# Patient Record
Sex: Female | Born: 1955 | Race: White | Hispanic: No | Marital: Married | Smoking: Never smoker
Health system: Southern US, Community
[De-identification: ages and names within clinical notes are randomized; demographics above are authoritative.]

## PROBLEM LIST (undated history)

## (undated) DIAGNOSIS — K635 Polyp of colon: Secondary | ICD-10-CM

## (undated) DIAGNOSIS — F419 Anxiety disorder, unspecified: Secondary | ICD-10-CM

## (undated) DIAGNOSIS — F329 Major depressive disorder, single episode, unspecified: Secondary | ICD-10-CM

## (undated) DIAGNOSIS — L570 Actinic keratosis: Secondary | ICD-10-CM

## (undated) DIAGNOSIS — B019 Varicella without complication: Secondary | ICD-10-CM

## (undated) DIAGNOSIS — M1990 Unspecified osteoarthritis, unspecified site: Secondary | ICD-10-CM

## (undated) DIAGNOSIS — K589 Irritable bowel syndrome without diarrhea: Secondary | ICD-10-CM

## (undated) DIAGNOSIS — A6000 Herpesviral infection of urogenital system, unspecified: Secondary | ICD-10-CM

## (undated) DIAGNOSIS — C4491 Basal cell carcinoma of skin, unspecified: Secondary | ICD-10-CM

## (undated) DIAGNOSIS — F32A Depression, unspecified: Secondary | ICD-10-CM

## (undated) DIAGNOSIS — E559 Vitamin D deficiency, unspecified: Secondary | ICD-10-CM

## (undated) DIAGNOSIS — K5792 Diverticulitis of intestine, part unspecified, without perforation or abscess without bleeding: Secondary | ICD-10-CM

## (undated) DIAGNOSIS — W5501XA Bitten by cat, initial encounter: Secondary | ICD-10-CM

## (undated) HISTORY — DX: Diverticulitis of intestine, part unspecified, without perforation or abscess without bleeding: K57.92

## (undated) HISTORY — DX: Anxiety disorder, unspecified: F41.9

## (undated) HISTORY — PX: CATARACT EXTRACTION: SUR2

## (undated) HISTORY — DX: Major depressive disorder, single episode, unspecified: F32.9

## (undated) HISTORY — DX: Polyp of colon: K63.5

## (undated) HISTORY — DX: Irritable bowel syndrome without diarrhea: K58.9

## (undated) HISTORY — PX: CHOLECYSTECTOMY: SHX55

## (undated) HISTORY — DX: Depression, unspecified: F32.A

## (undated) HISTORY — PX: ABDOMINAL HYSTERECTOMY: SHX81

## (undated) HISTORY — DX: Herpesviral infection of urogenital system, unspecified: A60.00

## (undated) HISTORY — DX: Bitten by cat, initial encounter: W55.01XA

## (undated) HISTORY — DX: Actinic keratosis: L57.0

## (undated) HISTORY — DX: Varicella without complication: B01.9

## (undated) HISTORY — DX: Basal cell carcinoma of skin, unspecified: C44.91

## (undated) HISTORY — DX: Unspecified osteoarthritis, unspecified site: M19.90

## (undated) HISTORY — DX: Vitamin D deficiency, unspecified: E55.9

---

## 2007-02-19 ENCOUNTER — Ambulatory Visit: Payer: Self-pay

## 2007-03-19 ENCOUNTER — Ambulatory Visit: Payer: Self-pay | Admitting: Internal Medicine

## 2018-03-17 ENCOUNTER — Ambulatory Visit: Payer: Managed Care, Other (non HMO) | Admitting: Internal Medicine

## 2018-03-17 ENCOUNTER — Encounter: Payer: Self-pay | Admitting: Internal Medicine

## 2018-03-17 VITALS — BP 136/76 | HR 70 | Temp 98.5°F | Ht 62.0 in | Wt 116.4 lb

## 2018-03-17 DIAGNOSIS — Z23 Encounter for immunization: Secondary | ICD-10-CM

## 2018-03-17 DIAGNOSIS — Z1389 Encounter for screening for other disorder: Secondary | ICD-10-CM

## 2018-03-17 DIAGNOSIS — Z1231 Encounter for screening mammogram for malignant neoplasm of breast: Secondary | ICD-10-CM

## 2018-03-17 DIAGNOSIS — Z13220 Encounter for screening for lipoid disorders: Secondary | ICD-10-CM

## 2018-03-17 DIAGNOSIS — K581 Irritable bowel syndrome with constipation: Secondary | ICD-10-CM

## 2018-03-17 DIAGNOSIS — R1310 Dysphagia, unspecified: Secondary | ICD-10-CM

## 2018-03-17 DIAGNOSIS — E559 Vitamin D deficiency, unspecified: Secondary | ICD-10-CM

## 2018-03-17 DIAGNOSIS — Z13818 Encounter for screening for other digestive system disorders: Secondary | ICD-10-CM

## 2018-03-17 DIAGNOSIS — E8940 Asymptomatic postprocedural ovarian failure: Secondary | ICD-10-CM | POA: Insufficient documentation

## 2018-03-17 DIAGNOSIS — Z0000 Encounter for general adult medical examination without abnormal findings: Secondary | ICD-10-CM | POA: Diagnosis not present

## 2018-03-17 DIAGNOSIS — F419 Anxiety disorder, unspecified: Secondary | ICD-10-CM | POA: Diagnosis not present

## 2018-03-17 DIAGNOSIS — F329 Major depressive disorder, single episode, unspecified: Secondary | ICD-10-CM

## 2018-03-17 DIAGNOSIS — F32A Depression, unspecified: Secondary | ICD-10-CM | POA: Insufficient documentation

## 2018-03-17 DIAGNOSIS — Z1159 Encounter for screening for other viral diseases: Secondary | ICD-10-CM

## 2018-03-17 NOTE — Progress Notes (Signed)
Pre visit review using our clinic review tool, if applicable. No additional management support is needed unless otherwise documented below in the visit note. 

## 2018-03-17 NOTE — Progress Notes (Signed)
Chief Complaint  Patient presents with  . Establish Care   NP  1. Anxiety and depression controlled on prozac 40 and wellbutrin 300 mg qd 2.IBS C and abdominal pain chronic linzess 290 helps and gabapentin 400 qid helps per pt bentyl did not help in the past nor Levsin for abdominal pain  3. H/o vitamin D def pt wants to sch comprehensive labs  4. Intermittently co dysphagia and trouble swallowing food she reports dentist saw something in her throat and wanted PCP to check but normal exam today advised if sx's continue refer GI or ENT. Past EGD 2014  Review of Systems  Constitutional: Negative for weight loss.  HENT: Negative for hearing loss and sore throat.   Eyes: Negative for blurred vision.  Respiratory: Negative for shortness of breath.   Cardiovascular: Negative for chest pain.  Gastrointestinal: Negative for heartburn.       +dysphagia   Musculoskeletal: Negative for joint pain.  Neurological: Negative for headaches.  Psychiatric/Behavioral: Negative for depression. The patient is not nervous/anxious.    Past Medical History:  Diagnosis Date  . Anxiety   . Chicken pox   . Colon polyps    2014   . Depression   . Diverticulitis   . IBS (irritable bowel syndrome)    constipation  . Vitamin D deficiency    Past Surgical History:  Procedure Laterality Date  . ABDOMINAL HYSTERECTOMY     2007 fibroids   . CHOLECYSTECTOMY     2016   Family History  Problem Relation Age of Onset  . Arthritis Mother   . Depression Mother   . Hearing loss Mother   . Hypertension Mother   . Asthma Father   . Cancer Father        kidney cancer not a smoker   . Kidney disease Father   . Arthritis Sister   . Hypertension Sister   . Cancer Brother        nmsc skin ca   Social History   Socioeconomic History  . Marital status: Married    Spouse name: Not on file  . Number of children: Not on file  . Years of education: Not on file  . Highest education level: Not on file   Occupational History  . Not on file  Social Needs  . Financial resource strain: Not on file  . Food insecurity:    Worry: Not on file    Inability: Not on file  . Transportation needs:    Medical: Not on file    Non-medical: Not on file  Tobacco Use  . Smoking status: Never Smoker  . Smokeless tobacco: Never Used  . Tobacco comment: light smoker short duration   Substance and Sexual Activity  . Alcohol use: Yes    Comment: occassionally  . Drug use: Not Currently  . Sexual activity: Not Currently    Comment: men  Lifestyle  . Physical activity:    Days per week: Not on file    Minutes per session: Not on file  . Stress: Not on file  Relationships  . Social connections:    Talks on phone: Not on file    Gets together: Not on file    Attends religious service: Not on file    Active member of club or organization: Not on file    Attends meetings of clubs or organizations: Not on file    Relationship status: Not on file  . Intimate partner violence:    Fear  of current or ex partner: Not on file    Emotionally abused: Not on file    Physically abused: Not on file    Forced sexual activity: Not on file  Other Topics Concern  . Not on file  Social History Narrative   Divorced    1. 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 buPROPion (WELLBUTRIN XL) 300 MG 24 hr tablet Take 300 mg by mouth daily.  Marland Kitchen estradiol (ESTRACE) 0.5 MG tablet Take 0.5 mg by mouth daily.  Marland Kitchen FLUoxetine (PROZAC) 40 MG capsule Take 40 mg by mouth daily.  Marland Kitchen gabapentin (NEURONTIN) 400 MG capsule Take 400 mg by mouth 4 (four) times daily.   Marland Kitchen linaclotide (LINZESS) 290 MCG CAPS capsule Take 290 mcg by mouth daily before breakfast.  . MELATONIN PO Take 0.5 mg by mouth daily.   Allergies  Allergen Reactions  . Penicillins     Numbness in fingers child     No results found for this or any previous visit (from the  past 2160 hour(s)). Objective  Body mass index is 21.29 kg/m. Wt Readings from Last 3 Encounters:  03/17/18 116 lb 6.4 oz (52.8 kg)   Temp Readings from Last 3 Encounters:  03/17/18 98.5 F (36.9 C) (Oral)   BP Readings from Last 3 Encounters:  03/17/18 136/76   Pulse Readings from Last 3 Encounters:  03/17/18 70    Physical Exam  Constitutional: She is oriented to person, place, and time. Vital signs are normal. She appears well-developed and well-nourished. She is cooperative.  HENT:  Head: Normocephalic and atraumatic.  Mouth/Throat: Oropharynx is clear and moist and mucous membranes are normal.  Eyes: Pupils are equal, round, and reactive to light. Conjunctivae are normal.  Cardiovascular: Normal rate, regular rhythm and normal heart sounds.  Pulmonary/Chest: Effort normal and breath sounds normal.  Neurological: She is alert and oriented to person, place, and time. Gait normal.  Skin: Skin is warm, dry and intact.  Psychiatric: She has a normal mood and affect. Her speech is normal and behavior is normal. Judgment and thought content normal. Cognition and memory are normal.  Nursing note and vitals reviewed.   Assessment   1. Anxiety and depression  2. Postsurgical menopause  3. IBS C 4. Vit D def  5. Dysphagia intermittently  6. HM Plan   1. Cont meds  sch fasting labs  2. Continue estradiol 0.5 mg qd and gabapentin helping  3. Cont linzess per pt gabapentin helping IBS though unfamiliar with indication for chronic ab pain she states helps nerve pain  4. Labs  5. If not better refer GI vs ENT  6.  Flu shot given today  Disc shingrix vaccine today  Get records Tdap former PCP Get records mammogram former [facility] [place], EGD/colonoscopy (h/o polpys 2014 due repeat 5 years pt declines for now - former PCP Dr. Lourena Simmonds in [place]  -ordered mammo given info pt to schedule  S/p hysterectomy for fibroids 2007 no h/o abnormal pap  Referred  mammogram today given info to schedule  Former light smoker not long term  H/o Aks dermatology saw last years no need for now consider in future   Provider: Dr. Olivia Mackie McLean-Scocuzza-[facility]

## 2018-03-17 NOTE — Patient Instructions (Addendum)
Shingrix vaccine  Zeasorb AF Powder for rash under breasts     Intertrigo Intertrigo is skin irritation or inflammation (dermatitis) that occurs when folds of skin rub together. The irritation can cause a rash and make skin raw and itchy. This condition most commonly occurs in the skin folds of these areas:  Toes.  Armpits.  Groin.  Belly.  Breasts.  Buttocks.  Intertrigo is not passed from person to person (is not contagious). What are the causes? This condition is caused by heat, moisture, friction, and lack of air circulation. The condition can be made worse by:  Sweat.  Bacteria or a fungus, such as yeast.  What increases the risk? This condition is more likely to occur if you have moisture in your skin folds. It is also more likely to develop in people who:  Have diabetes.  Are overweight.  Are on bed rest.  Live in a warm and moist climate.  Wear splints, braces, or other medical devices.  Are not able to control their bowels or bladder (have incontinence).  What are the signs or symptoms? Symptoms of this condition include:  A pink or red skin rash.  Brown patches on the skin.  Raw or scaly skin.  Itchiness.  A burning feeling.  Bleeding.  Leaking fluid.  A bad smell.  How is this diagnosed? This condition is diagnosed with a medical history and physical exam. You may also have a skin swab to test for bacteria or a fungus, such as yeast. How is this treated? Treatment may include:  Cleaning and drying your skin.  An oral antibiotic medicine or antibiotic skin cream for a bacterial infection.  Antifungal cream or pills for an infection that was caused by a fungus, such as yeast.  Steroid ointment to relieve itchiness and irritation.  Follow these instructions at home:  Keep the affected area clean and dry.  Do not scratch your skin.  Stay in a cool environment as much as possible. Use an air conditioner or fan, if  available.  Apply over-the-counter and prescription medicines only as told by your health care provider.  If you were prescribed an antibiotic medicine, use it as told by your health care provider. Do not stop using the antibiotic even if your condition improves.  Keep all follow-up visits as told by your health care provider. This is important. How is this prevented?  Maintain a healthy weight.  Take care of your feet, especially if you have diabetes. Foot care includes: ? Wearing shoes that fit well. ? Keeping your feet dry. ? Wearing clean, breathable socks.  Protect the skin around your groin and buttocks, especially if you have incontinence. Skin protection includes: ? Following a regular cleaning routine. ? Using moisturizers and skin protectants. ? Changing protection pads frequently.  Do not wear tight clothes. Wear clothes that are loose and absorbent. Wear clothes that are made of cotton.  Wear a bra that gives good support, if needed.  Shower and dry yourself thoroughly after activity. Use a hair dryer on a cool setting to dry between skin folds, especially after you bathe.  If you have diabetes, keep your blood sugar under control. Contact a health care provider if:  Your symptoms do not improve with treatment.  Your symptoms get worse or they spread.  You notice increased redness and warmth.  You have a fever. This information is not intended to replace advice given to you by your health care provider. Make sure you discuss any  questions you have with your health care provider. Document Released: 06/17/2005 Document Revised: 11/23/2015 Document Reviewed: 12/19/2014 Elsevier Interactive Patient Education  2018 [place] Zoster (Shingles) Vaccine, RZV: What You Need to Know 1. Why get vaccinated? Shingles (also called herpes zoster, or just zoster) is a painful skin rash, often with blisters. Shingles is caused by the varicella zoster virus,  the same virus that causes chickenpox. After you have chickenpox, the virus stays in your body and can cause shingles later in life. You can't catch shingles from another person. However, a person who has never had chickenpox (or chickenpox vaccine) could get chickenpox from someone with shingles. A shingles rash usually appears on one side of the face or body and heals within 2 to 4 weeks. Its main symptom is pain, which can be severe. Other symptoms can include fever, headache, chills and upset stomach. Very rarely, a shingles infection can lead to pneumonia, hearing problems, blindness, brain inflammation (encephalitis), or death. For about 1 person in 5, severe pain can continue even long after the rash has cleared up. This long-lasting pain is called post-herpetic neuralgia (PHN). Shingles is far more common in people 49 years of age and older than in younger people, and the risk increases with age. It is also more common in people whose immune system is weakened because of a disease such as cancer, or by drugs such as steroids or chemotherapy. At least 1 million people a year in the [place] get shingles. 2. Shingles vaccine (recombinant) Recombinant shingles vaccine was approved by FDA in 2017 for the prevention of shingles. In clinical trials, it was more than 90% effective in preventing shingles. It can also reduce the likelihood of PHN. Two doses, 2 to 6 months apart, are recommended for adults 60 and older. This vaccine is also recommended for people who have already gotten the live shingles vaccine (Zostavax). There is no live virus in this vaccine. 3. Some people should not get this vaccine Tell your vaccine provider if you:  Have any severe, life-threatening allergies. A person who has ever had a life-threatening allergic reaction after a dose of recombinant shingles vaccine, or has a severe allergy to any component of this vaccine, may be advised not to be vaccinated. Ask your health  care provider if you want information about vaccine components.  Are pregnant or breastfeeding. There is not much information about use of recombinant shingles vaccine in pregnant or nursing women. Your healthcare provider might recommend delaying vaccination.  Are not feeling well. If you have a mild illness, such as a cold, you can probably get the vaccine today. If you are moderately or severely ill, you should probably wait until you recover. Your doctor can advise you.  4. Risks of a vaccine reaction With any medicine, including vaccines, there is a chance of reactions. After recombinant shingles vaccination, a person might experience:  Pain, redness, soreness, or swelling at the site of the injection  Headache, muscle aches, fever, shivering, fatigue  In clinical trials, most people got a sore arm with mild or moderate pain after vaccination, and some also had redness and swelling where they got the shot. Some people felt tired, had muscle pain, a headache, shivering, fever, stomach pain, or nausea. About 1 out of 6 people who got recombinant zoster vaccine experienced side effects that prevented them from doing regular activities. Symptoms went away on their own in about 2 to 3 days. Side effects were more common in  younger people. You should still get the second dose of recombinant zoster vaccine even if you had one of these reactions after the first dose. Other things that could happen after this vaccine:  People sometimes faint after medical procedures, including vaccination. Sitting or lying down for about 15 minutes can help prevent fainting and injuries caused by a fall. Tell your provider if you feel dizzy or have vision changes or ringing in the ears.  Some people get shoulder pain that can be more severe and longer-lasting than routine soreness that can follow injections. This happens very rarely.  Any medication can cause a severe allergic reaction. Such reactions to a vaccine  are estimated at about 1 in a million doses, and would happen within a few minutes to a few hours after the vaccination. As with any medicine, there is a very remote chance of a vaccine causing a serious injury or death. The safety of vaccines is always being monitored. For more information, visit: http://www.aguilar.org/ 5. What if there is a serious problem? What should I look for?  Look for anything that concerns you, such as signs of a severe allergic reaction, very high fever, or unusual behavior. Signs of a severe allergic reaction can include hives, swelling of the face and throat, difficulty breathing, a fast heartbeat, dizziness, and weakness. These would usually start a few minutes to a few hours after the vaccination. What should I do?  If you think it is a severe allergic reaction or other emergency that can't wait, call 9-1-1 and get to the nearest hospital. Otherwise, call your health care provider. Afterward, the reaction should be reported to the Vaccine Adverse Event Reporting System (VAERS). Your doctor should file this report, or you can do it yourself through the VAERS web site atwww.vaers.https://www.bray.com/ by calling 269-173-6391. VAERS does not give medical advice. 6. How can I learn more?  Ask your healthcare provider. He or she can give you the vaccine package insert or suggest other sources of information.  Call your local or state health department.  Contact the Centers for Disease Control and Prevention (CDC): ? Call (623)115-3632 (1-800-CDC-INFO) or ? Visit the CDC's website at http://hunter.com/ CDC Vaccine Information Statement (VIS) Recombinant Zoster Vaccine (08/12/2016) This information is not intended to replace advice given to you by your health care provider. Make sure you discuss any questions you have with your health care provider. Document Released: 08/27/2016 Document Revised: 08/27/2016 Document Reviewed: 08/27/2016 Elsevier Interactive Patient  Education  [employer].

## 2018-03-25 ENCOUNTER — Other Ambulatory Visit (INDEPENDENT_AMBULATORY_CARE_PROVIDER_SITE_OTHER): Payer: Managed Care, Other (non HMO)

## 2018-03-25 DIAGNOSIS — Z13220 Encounter for screening for lipoid disorders: Secondary | ICD-10-CM | POA: Diagnosis not present

## 2018-03-25 DIAGNOSIS — F419 Anxiety disorder, unspecified: Secondary | ICD-10-CM

## 2018-03-25 DIAGNOSIS — Z13818 Encounter for screening for other digestive system disorders: Secondary | ICD-10-CM | POA: Diagnosis not present

## 2018-03-25 DIAGNOSIS — E559 Vitamin D deficiency, unspecified: Secondary | ICD-10-CM

## 2018-03-25 DIAGNOSIS — Z1389 Encounter for screening for other disorder: Secondary | ICD-10-CM

## 2018-03-25 DIAGNOSIS — F329 Major depressive disorder, single episode, unspecified: Secondary | ICD-10-CM

## 2018-03-25 DIAGNOSIS — Z0000 Encounter for general adult medical examination without abnormal findings: Secondary | ICD-10-CM

## 2018-03-25 DIAGNOSIS — Z1159 Encounter for screening for other viral diseases: Secondary | ICD-10-CM

## 2018-03-25 LAB — CBC WITH DIFFERENTIAL/PLATELET
BASOS ABS: 0 10*3/uL (ref 0.0–0.1)
Basophils Relative: 1 % (ref 0.0–3.0)
Eosinophils Absolute: 0.1 10*3/uL (ref 0.0–0.7)
Eosinophils Relative: 1.7 % (ref 0.0–5.0)
HCT: 38.2 % (ref 36.0–46.0)
Hemoglobin: 12.8 g/dL (ref 12.0–15.0)
LYMPHS ABS: 1.3 10*3/uL (ref 0.7–4.0)
Lymphocytes Relative: 29.1 % (ref 12.0–46.0)
MCHC: 33.6 g/dL (ref 30.0–36.0)
MCV: 89.8 fl (ref 78.0–100.0)
MONO ABS: 0.4 10*3/uL (ref 0.1–1.0)
Monocytes Relative: 9.3 % (ref 3.0–12.0)
NEUTROS PCT: 58.9 % (ref 43.0–77.0)
Neutro Abs: 2.6 10*3/uL (ref 1.4–7.7)
Platelets: 325 10*3/uL (ref 150.0–400.0)
RBC: 4.25 Mil/uL (ref 3.87–5.11)
RDW: 12.6 % (ref 11.5–15.5)
WBC: 4.4 10*3/uL (ref 4.0–10.5)

## 2018-03-25 LAB — COMPREHENSIVE METABOLIC PANEL
ALK PHOS: 60 U/L (ref 39–117)
ALT: 21 U/L (ref 0–35)
AST: 21 U/L (ref 0–37)
Albumin: 4.3 g/dL (ref 3.5–5.2)
BUN: 19 mg/dL (ref 6–23)
CO2: 25 mEq/L (ref 19–32)
Calcium: 9.1 mg/dL (ref 8.4–10.5)
Chloride: 103 mEq/L (ref 96–112)
Creatinine, Ser: 0.8 mg/dL (ref 0.40–1.20)
GFR: 77.74 mL/min (ref >60.00)
GLUCOSE: 83 mg/dL (ref 70–99)
POTASSIUM: 4 meq/L (ref 3.5–5.1)
SODIUM: 137 meq/L (ref 135–145)
TOTAL PROTEIN: 6.7 g/dL (ref 6.0–8.3)
Total Bilirubin: 0.4 mg/dL (ref 0.2–1.2)

## 2018-03-25 LAB — LIPID PANEL
CHOL/HDL RATIO: 2
Cholesterol: 183 mg/dL (ref 0–200)
HDL: 76.7 mg/dL (ref >39.00)
LDL Cholesterol: 75 mg/dL (ref 0–99)
NONHDL: 106.58
Triglycerides: 159 mg/dL — ABNORMAL HIGH (ref 0.0–149.0)
VLDL: 31.8 mg/dL (ref 0.0–40.0)

## 2018-03-25 LAB — TSH: TSH: 4.17 u[IU]/mL (ref 0.35–4.50)

## 2018-03-25 LAB — T4, FREE: Free T4: 0.7 ng/dL (ref 0.60–1.60)

## 2018-03-25 LAB — VITAMIN D 25 HYDROXY (VIT D DEFICIENCY, FRACTURES): VITD: 20.22 ng/mL — AB (ref 30.00–100.00)

## 2018-03-26 LAB — URINALYSIS, ROUTINE W REFLEX MICROSCOPIC
Bilirubin Urine: NEGATIVE
Glucose, UA: NEGATIVE
Hgb urine dipstick: NEGATIVE
Ketones, ur: NEGATIVE
LEUKOCYTES UA: NEGATIVE
Nitrite: NEGATIVE
Protein, ur: NEGATIVE
SPECIFIC GRAVITY, URINE: 1.018 (ref 1.001–1.03)
pH: 6.5 (ref 5.0–8.0)

## 2018-03-26 LAB — HEPATITIS C ANTIBODY
Hepatitis C Ab: NONREACTIVE
SIGNAL TO CUT-OFF: 0.01 (ref <1.00)

## 2018-03-27 ENCOUNTER — Ambulatory Visit: Payer: Self-pay

## 2018-03-27 NOTE — Telephone Encounter (Signed)
Pt. Given lab results and instructions. See result note.

## 2018-04-07 ENCOUNTER — Telehealth: Payer: Self-pay

## 2018-04-07 NOTE — Telephone Encounter (Signed)
Copied from [place] [id number]. Topic: Referral - Request >> Apr 07, 2018  1:31 PM Jamie Salas wrote: Reason for CRM: Pt is requesting a referral be sent to [facility] in [place] for a mammogram if possible. Pt is also requesting a copy of her office visit summary from last visit 9/17. Please advise.

## 2018-04-07 NOTE — Telephone Encounter (Signed)
[name] mail AVS to patient   Jamie Salas move mammogram to requested facility please [facility] in [place] for mammogram   Thanks [place]

## 2018-05-08 ENCOUNTER — Encounter: Payer: Self-pay | Admitting: Internal Medicine

## 2018-05-08 ENCOUNTER — Ambulatory Visit: Payer: Managed Care, Other (non HMO) | Admitting: Internal Medicine

## 2018-05-08 VITALS — BP 132/90 | HR 71 | Temp 98.1°F | Resp 16 | Ht 62.0 in | Wt 117.4 lb

## 2018-05-08 DIAGNOSIS — H6123 Impacted cerumen, bilateral: Secondary | ICD-10-CM | POA: Diagnosis not present

## 2018-05-08 DIAGNOSIS — M542 Cervicalgia: Secondary | ICD-10-CM | POA: Diagnosis not present

## 2018-05-08 DIAGNOSIS — S61452A Open bite of left hand, initial encounter: Secondary | ICD-10-CM

## 2018-05-08 DIAGNOSIS — D170 Benign lipomatous neoplasm of skin and subcutaneous tissue of head, face and neck: Secondary | ICD-10-CM | POA: Diagnosis not present

## 2018-05-08 DIAGNOSIS — M5412 Radiculopathy, cervical region: Secondary | ICD-10-CM

## 2018-05-08 DIAGNOSIS — W5501XA Bitten by cat, initial encounter: Secondary | ICD-10-CM

## 2018-05-08 MED ORDER — METRONIDAZOLE 500 MG PO TABS: 500.0000 mg | ORAL_TABLET | Freq: Three times a day (TID) | ORAL | 0 refills | Status: DC

## 2018-05-08 MED ORDER — DOXYCYCLINE HYCLATE 100 MG PO TABS: 100.0000 mg | ORAL_TABLET | Freq: Two times a day (BID) | ORAL | 0 refills | Status: DC

## 2018-05-08 MED ORDER — FLUCONAZOLE 150 MG PO TABS: 150.0000 mg | ORAL_TABLET | Freq: Once | ORAL | 0 refills | Status: AC

## 2018-05-08 NOTE — Progress Notes (Signed)
Chief Complaint  Patient presents with  . Acute Visit    cat bite   F/u  1. C/o cat bite to left hand yesterday 05/07/18 hand is red and swollen not really painful  2. C/o wax in b/l ears and h/o irrigation in the past nothing trie dyet  3. 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 Neck pain radiates to her arms and shoulder and worse with activity one of her friends was telling her about Humira so she asks about this today    Review of Systems  Constitutional: Negative for weight loss.  HENT:       +ear wax b/l ears    Eyes: Negative for blurred vision.  Cardiovascular: Negative for chest pain.  Musculoskeletal: Positive for joint pain and neck pain.  Skin: Positive for rash.  Neurological: Negative for headaches.  Psychiatric/Behavioral: Negative for depression.   Past Medical History:  Diagnosis Date  . Actinic keratoses   . Anxiety   . Arthritis   . Chicken pox   . Colon polyps    2014   . Depression   . Diverticulitis   . IBS (irritable bowel syndrome)    constipation  . Vitamin D deficiency    Past Surgical History:  Procedure Laterality Date  . ABDOMINAL HYSTERECTOMY     2007 fibroids   . CHOLECYSTECTOMY     2016   Family History  Problem Relation Age of Onset  . Arthritis Mother   . Depression Mother   . Hearing loss Mother   . Hypertension Mother   . Asthma Father   . Cancer Father        kidney cancer not a smoker   . Kidney disease Father   . Arthritis Sister   . Hypertension Sister   . Cancer Brother        nmsc skin ca   Social History   Socioeconomic History  . Marital status: Married    Spouse name: Not on file  . Number of children: Not on file  . Years of education: Not on file  . Highest education level: Not on file  Occupational History  . Not on file  Social Needs  . Financial resource  strain: Not on file  . Food insecurity:    Worry: Not on file    Inability: Not on file  . Transportation needs:    Medical: Not on file    Non-medical: Not on file  Tobacco Use  . Smoking status: Never Smoker  . Smokeless tobacco: Never Used  . Tobacco comment: light smoker short duration   Substance and Sexual Activity  . Alcohol use: Yes    Comment: occassionally  . Drug use: Not Currently  . Sexual activity: Not Currently    Comment: men  Lifestyle  . Physical activity:    Days per week: Not on file    Minutes per session: Not on file  . Stress: Not on file  Relationships  . Social connections:    Talks on phone: Not on file    Gets together: Not on file    Attends religious service: Not on file    Active member of club or organization: Not on file    Attends meetings of clubs or organizations: Not on file    Relationship status: Not on file  .  Intimate partner violence:    Fear of current or ex partner: Not on file    Emotionally abused: Not on file    Physically abused: Not on file    Forced sexual activity: Not on file  Other Topics Concern  . Not on file  Social History Narrative   Divorced    1. 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 buPROPion (WELLBUTRIN XL) 300 MG 24 hr tablet Take 300 mg by mouth daily.  Marland Kitchen estradiol (ESTRACE) 0.5 MG tablet Take 0.5 mg by mouth daily.  Marland Kitchen FLUoxetine (PROZAC) 40 MG capsule Take 40 mg by mouth daily.  Marland Kitchen gabapentin (NEURONTIN) 400 MG capsule Take 400 mg by mouth 4 (four) times daily.   Marland Kitchen linaclotide (LINZESS) 290 MCG CAPS capsule Take 290 mcg by mouth daily before breakfast.  . MELATONIN PO Take 0.5 mg by mouth daily.   Allergies  Allergen Reactions  . Penicillins     Numbness in fingers child     Recent Results (from the past 2160 hour(s))  Hepatitis C antibody     Status: None   Collection Time: 03/25/18  9:50 AM  Result  Value Ref Range   Hepatitis C Ab NON-REACTIVE NON-REACTI   SIGNAL TO CUT-OFF 0.01 <1.00    Comment: . HCV antibody was non-reactive. There is no laboratory  evidence of HCV infection. . In most cases, no further action is required. However, if recent HCV exposure is suspected, a test for HCV RNA (test code [id number]) is suggested. . For additional information please refer to http://education.quest[facility].com/faq/FAQ22v1 (This link is being provided for informational/ educational purposes only.) .   Vitamin D (25 hydroxy)     Status: Abnormal   Collection Time: 03/25/18  9:50 AM  Result Value Ref Range   VITD 20.22 (L) 30.00 - 100.00 ng/mL  Urinalysis, Routine w reflex microscopic     Status: None   Collection Time: 03/25/18  9:50 AM  Result Value Ref Range   Color, Urine YELLOW YELLOW   APPearance CLEAR CLEAR   Specific Gravity, Urine 1.018 1.001 - 1.03   pH 6.5 5.0 - 8.0   Glucose, UA NEGATIVE NEGATIVE   Bilirubin Urine NEGATIVE NEGATIVE   Ketones, ur NEGATIVE NEGATIVE   Hgb urine dipstick NEGATIVE NEGATIVE   Protein, ur NEGATIVE NEGATIVE   Nitrite NEGATIVE NEGATIVE   Leukocytes, UA NEGATIVE NEGATIVE  T4, free     Status: None   Collection Time: 03/25/18  9:50 AM  Result Value Ref Range   Free T4 0.70 0.60 - 1.60 ng/dL    Comment: Specimens from patients who are undergoing biotin therapy and /or ingesting biotin supplements may contain high levels of biotin.  The higher biotin concentration in these specimens interferes with this Free T4 assay.  Specimens that contain high levels  of biotin may cause false high results for this Free T4 assay.  Please interpret results in light of the total clinical presentation of the patient.    TSH     Status: None   Collection Time: 03/25/18  9:50 AM  Result Value Ref Range   TSH 4.17 0.35 - 4.50 uIU/mL  Lipid panel     Status: Abnormal   Collection Time: 03/25/18  9:50 AM  Result Value Ref Range   Cholesterol 183 0 - 200 mg/dL  Comment: ATP III Classification       Desirable:  < 200 mg/dL               Borderline High:  200 - 239 mg/dL          High:  > = 240 mg/dL   Triglycerides 159.0 (H) 0.0 - 149.0 mg/dL    Comment: Normal:  <150 mg/dLBorderline High:  150 - 199 mg/dL   HDL 76.70 >39.00 mg/dL   VLDL 31.8 0.0 - 40.0 mg/dL   LDL Cholesterol 75 0 - 99 mg/dL   Total CHOL/HDL Ratio 2     Comment:                Men          Women1/2 Average Risk     3.4          3.3Average Risk          5.0          4.42X Average Risk          9.6          7.13X Average Risk          15.0          11.0                       NonHDL 106.58     Comment: NOTE:  Non-HDL goal should be 30 mg/dL higher than patient's LDL goal (i.e. LDL goal of < 70 mg/dL, would have non-HDL goal of < 100 mg/dL)  CBC with Differential/Platelet     Status: None   Collection Time: 03/25/18  9:50 AM  Result Value Ref Range   WBC 4.4 4.0 - 10.5 K/uL   RBC 4.25 3.87 - 5.11 Mil/uL   Hemoglobin 12.8 12.0 - 15.0 g/dL   HCT 38.2 36.0 - 46.0 %   MCV 89.8 78.0 - 100.0 fl   MCHC 33.6 30.0 - 36.0 g/dL   RDW 12.6 11.5 - 15.5 %   Platelets 325.0 150.0 - 400.0 K/uL   Neutrophils Relative % 58.9 43.0 - 77.0 %   Lymphocytes Relative 29.1 12.0 - 46.0 %   Monocytes Relative 9.3 3.0 - 12.0 %   Eosinophils Relative 1.7 0.0 - 5.0 %   Basophils Relative 1.0 0.0 - 3.0 %   Neutro Abs 2.6 1.4 - 7.7 K/uL   Lymphs Abs 1.3 0.7 - 4.0 K/uL   Monocytes Absolute 0.4 0.1 - 1.0 K/uL   Eosinophils Absolute 0.1 0.0 - 0.7 K/uL   Basophils Absolute 0.0 0.0 - 0.1 K/uL  Comprehensive metabolic panel     Status: None   Collection Time: 03/25/18  9:50 AM  Result Value Ref Range   Sodium 137 135 - 145 mEq/L   Potassium 4.0 3.5 - 5.1 mEq/L   Chloride 103 96 - 112 mEq/L   CO2 25 19 - 32 mEq/L   Glucose, Bld 83 70 - 99 mg/dL   BUN 19 6 - 23 mg/dL   Creatinine, Ser 0.80 0.40 - 1.20 mg/dL   Total Bilirubin 0.4 0.2 - 1.2 mg/dL   Alkaline Phosphatase 60 39 - 117 U/L   AST 21 0 - 37 U/L    ALT 21 0 - 35 U/L   Total Protein 6.7 6.0 - 8.3 g/dL   Albumin 4.3 3.5 - 5.2 g/dL   Calcium 9.1 8.4 - 10.5 mg/dL   GFR 77.74 >60.00 mL/min  Objective  Body mass index is 21.47 kg/m. Wt Readings from Last 3 Encounters:  05/08/18 117 lb 6 oz (53.2 kg)  03/17/18 116 lb 6.4 oz (52.8 kg)   Temp Readings from Last 3 Encounters:  05/08/18 98.1 F (36.7 C) (Oral)  03/17/18 98.5 F (36.9 C) (Oral)   BP Readings from Last 3 Encounters:  05/08/18 132/90  03/17/18 136/76   Pulse Readings from Last 3 Encounters:  05/08/18 71  03/17/18 70    Physical Exam  Constitutional: She is oriented to person, place, and time. Vital signs are normal. She appears well-developed and well-nourished. She is cooperative.  HENT:  Head: Normocephalic and atraumatic.  Mouth/Throat: Oropharynx is clear and moist and mucous membranes are normal.  Eyes: Pupils are equal, round, and reactive to light. Conjunctivae are normal.  Neck:    Cardiovascular: Normal rate, regular rhythm and normal heart sounds.  Pulmonary/Chest: Effort normal and breath sounds normal.  Musculoskeletal:       Cervical back: She exhibits tenderness.  Neurological: She is alert and oriented to person, place, and time. Gait normal.  Skin: Skin is warm and dry. Rash noted. There is erythema.     Psychiatric: She has a normal mood and affect. Her speech is normal and behavior is normal. Judgment and thought content normal. Cognition and memory are normal.  Nursing note and vitals reviewed.   Assessment   1. Cat bite left hand  2. Cerumen impaction b/l ears  3. 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 HM Plan  1. Doxy bid x 10 days + flagyl 500 tid with prn Diflucan utd Tdap  2. rec otc debrox and will remove at f/u  Check ears at f/u and clean  3. Mri neck  4.  Flu shot utd  Disc shingrix vaccine today  Tdap had 06/01/12   Get records mammogram former [facility] [place], EGD/colonoscopy (h/o polpys 2014 due repeat  5 years pt declines for now - former PCP Dr. Lourena Simmonds in [place]  -ordered mammo given info pt to schedule  -mammo had 08/06/17 negative dense breasts   S/p hysterectomy for fibroids 2007 no h/o abnormal pap  Former light smoker not long term  H/o [facility] dermatology saw last years no need for now consider in future  Colonoscopy disc at f/u   Provider: Dr. Olivia Mackie McLean-Scocuz[facility]

## 2018-05-08 NOTE — Patient Instructions (Signed)
Debrox ear drops use 1 week before seeing me next time and 1 week now    Carbamide Peroxide ear solution What is this medicine? CARBAMIDE PEROXIDE (CAR bah mide per OX ide) is used to soften and help remove ear wax. This medicine may be used for other purposes; ask your health care provider or pharmacist if you have questions. COMMON BRAND NAME(S): Auro Ear, Auro Earache Relief, Debrox, Ear Drops, Ear Wax Removal, Ear Wax Remover, Earwax Treatment, Murine, Thera-Ear What should I tell my health care provider before I take this medicine? They need to know if you have any of these conditions: -dizziness -ear discharge -ear pain, irritation or rash -infection -perforated eardrum (hole in eardrum) -an unusual or allergic reaction to carbamide peroxide, glycerin, hydrogen peroxide, other medicines, foods, dyes, or preservatives -pregnant or trying to get pregnant -breast-feeding How should I use this medicine? This medicine is only for use in the outer ear canal. Follow the directions carefully. Wash hands before and after use. The solution may be warmed by holding the bottle in the hand for 1 to 2 minutes. Lie with the affected ear facing upward. Place the proper number of drops into the ear canal. After the drops are instilled, remain lying with the affected ear upward for 5 minutes to help the drops stay in the ear canal. A cotton ball may be gently inserted at the ear opening for no longer than 5 to 10 minutes to ensure retention. Repeat, if necessary, for the opposite ear. Do not touch the tip of the dropper to the ear, fingertips, or other surface. Do not rinse the dropper after use. Keep container tightly closed. Talk to your pediatrician regarding the use of this medicine in children. While this drug may be used in children as young as 12 years for selected conditions, precautions do apply. Overdosage: If you think you have taken too much of this medicine contact a poison control center or  emergency room at once. NOTE: This medicine is only for you. Do not share this medicine with others. What if I miss a dose? If you miss a dose, use it as soon as you can. If it is almost time for your next dose, use only that dose. Do not use double or extra doses. What may interact with this medicine? Interactions are not expected. Do not use any other ear products without asking your doctor or health care professional. This list may not describe all possible interactions. Give your health care provider a list of all the medicines, herbs, non-prescription drugs, or dietary supplements you use. Also tell them if you smoke, drink alcohol, or use illegal drugs. Some items may interact with your medicine. What should I watch for while using this medicine? This medicine is not for long-term use. Do not use for more than 4 days without checking with your health care professional. Contact your doctor or health care professional if your condition does not start to get better within a few days or if you notice burning, redness, itching or swelling. What side effects may I notice from receiving this medicine? Side effects that you should report to your doctor or health care professional as soon as possible: -allergic reactions like skin rash, itching or hives, swelling of the face, lips, or tongue -burning, itching, and redness -worsening ear pain -rash Side effects that usually do not require medical attention (report to your doctor or health care professional if they continue or are bothersome): -abnormal sensation while putting the  drops in the ear -temporary reduction in hearing (but not complete loss of hearing) This list may not describe all possible side effects. Call your doctor for medical advice about side effects. You may report side effects to FDA at 1-800-FDA-1088. Where should I keep my medicine? Keep out of the reach of children. Store at room temperature between 15 and 30 degrees C (59  and 86 degrees F) in a tight, light-resistant container. Keep bottle away from excessive heat and direct sunlight. Throw away any unused medicine after the expiration date. NOTE: This sheet is a summary. It may not cover all possible information. If you have questions about this medicine, talk to your doctor, pharmacist, or health care provider.  2018 Elsevier/Gold Standard (2007-09-29 14:00:02)  Animal Bite Animal bites can range from mild to serious. An animal bite can result in a scratch on the skin, a deep open cut, a puncture of the skin, a crush injury, or tearing away of the skin or a body part. A small bite from a house pet will usually not cause serious problems. However, some animal bites can become infected or injure a bone or other tissue. Bites from certain animals can be more dangerous because of the risk of spreading rabies, which is a serious viral infection. This risk is higher with bites from stray animals or wild animals, such as raccoons, foxes, skunks, and bats. Dogs are responsible for most animal bites. Children are bitten more often than adults. What are the signs or symptoms? Common symptoms of an animal bite include:  Pain.  Bleeding.  Swelling.  Bruising.  How is this diagnosed? This condition may be diagnosed based on a physical exam and medical history. Your health care provider will examine the wound and ask for details about the animal and how the bite happened. You may also have tests, such as:  Blood tests to check for infection or to determine if surgery is needed.  X-rays to check for damage to bones or joints.  Culture test. This uses a sample of fluid from the wound to check for infection.  How is this treated? Treatment varies depending on the location and type of animal bite and your medical history. Treatment may include:  Wound care. This often includes cleaning the wound, flushing the wound with saline solution, and applying a bandage  (dressing). Sometimes, the wound is left open to heal because of the high risk of infection. However, in some cases, the wound may be closed with stitches (sutures), staples, skin glue, or adhesive strips.  Antibiotic medicine.  Tetanus shot.  Rabies treatment if the animal could have rabies.  In some cases, bites that have become infected may require IV antibiotics and surgical treatment in the hospital. Follow these instructions at home: Wound care  Follow instructions from your health care provider about how to take care of your wound. Make sure you: ? Wash your hands with soap and water before you change your dressing. If soap and water are not available, use hand sanitizer. ? Change your dressing as told by your health care provider. ? Leave sutures, skin glue, or adhesive strips in place. These skin closures may need to be in place for 2 weeks or longer. If adhesive strip edges start to loosen and curl up, you may trim the loose edges. Do not remove adhesive strips completely unless your health care provider tells you to do that.  Check your wound every day for signs of infection. Watch for: ? Increasing  redness, swelling, or pain. ? Fluid, blood, or pus. General instructions  Take or apply over-the-counter and prescription medicines only as told by your health care provider.  If you were prescribed an antibiotic, take or apply it as told by your health care provider. Do not stop using the antibiotic even if your condition improves.  Keep the injured area raised (elevated) above the level of your heart while you are sitting or lying down, if this is possible.  If directed, apply ice to the injured area. ? Put ice in a plastic bag. ? Place a towel between your skin and the bag. ? Leave the ice on for 20 minutes, 2-3 times per day.  Keep all follow-up visits as told by your health care provider. This is important. Contact a health care provider if:  You have increasing  redness, swelling, or pain at the site of your wound.  You have a general feeling of sickness (malaise).  You feel nauseous or you vomit.  You have pain that does not get better. Get help right away if:  You have a red streak extending away from your wound.  You have fluid, blood, or pus coming from your wound.  You have a fever or chills.  You have trouble moving your injured area.  You have numbness or tingling extending beyond the wound. This information is not intended to replace advice given to you by your health care provider. Make sure you discuss any questions you have with your health care provider. Document Released: 03/05/2011 Document Revised: 10/25/2015 Document Reviewed: 11/02/2014 Elsevier Interactive Patient Education  [employer].

## 2018-05-19 ENCOUNTER — Telehealth: Payer: Self-pay

## 2018-05-19 NOTE — Telephone Encounter (Signed)
Copied from [place] [id number]. Topic: General - Inquiry >> May 19, 2018 11:33 AM Ahmed Prima L wrote: Reason for CRM: Patient is inquiring about the MRI that was going to be set up for her. Patient has not heard from the imaging dept.

## 2018-06-02 ENCOUNTER — Telehealth: Payer: Self-pay | Admitting: Internal Medicine

## 2018-06-02 MED ORDER — BUPROPION HCL ER (XL) 300 MG PO TB24: 300.0000 mg | ORAL_TABLET | Freq: Every day | ORAL | 1 refills | Status: DC

## 2018-06-02 MED ORDER — ESTRADIOL 0.5 MG PO TABS: 0.5000 mg | ORAL_TABLET | Freq: Every day | ORAL | 1 refills | Status: DC

## 2018-06-02 MED ORDER — FLUOXETINE HCL 40 MG PO CAPS: 40.0000 mg | ORAL_CAPSULE | Freq: Every day | ORAL | 1 refills | Status: DC

## 2018-06-02 NOTE — Telephone Encounter (Signed)
Medication has been refilled.

## 2018-06-02 NOTE — Telephone Encounter (Signed)
PT is requesting to have refills on her medications. The only one she can remember is the estradiol (ESTRACE) 0.5 MG tablet, but states there are other ones too, [facility] on [address].

## 2018-06-11 ENCOUNTER — Ambulatory Visit
Admission: RE | Admit: 2018-06-11 | Discharge: 2018-06-11 | Disposition: A | Discharge status: Home / Self Care | Payer: Managed Care, Other (non HMO) | Source: Ambulatory Visit | Attending: Internal Medicine | Admitting: Internal Medicine

## 2018-06-11 DIAGNOSIS — M4802 Spinal stenosis, cervical region: Secondary | ICD-10-CM | POA: Diagnosis not present

## 2018-06-11 DIAGNOSIS — M5412 Radiculopathy, cervical region: Secondary | ICD-10-CM | POA: Insufficient documentation

## 2018-06-11 DIAGNOSIS — M542 Cervicalgia: Secondary | ICD-10-CM | POA: Insufficient documentation

## 2018-06-11 DIAGNOSIS — D170 Benign lipomatous neoplasm of skin and subcutaneous tissue of head, face and neck: Secondary | ICD-10-CM | POA: Diagnosis present

## 2018-06-16 ENCOUNTER — Ambulatory Visit: Payer: Managed Care, Other (non HMO) | Admitting: Internal Medicine

## 2018-06-16 ENCOUNTER — Encounter: Payer: Self-pay | Admitting: Internal Medicine

## 2018-06-16 VITALS — BP 126/92 | HR 75 | Temp 99.0°F | Resp 14 | Ht 62.0 in | Wt 117.2 lb

## 2018-06-16 DIAGNOSIS — M542 Cervicalgia: Secondary | ICD-10-CM

## 2018-06-16 DIAGNOSIS — H6123 Impacted cerumen, bilateral: Secondary | ICD-10-CM

## 2018-06-16 DIAGNOSIS — M5412 Radiculopathy, cervical region: Secondary | ICD-10-CM

## 2018-06-16 DIAGNOSIS — Z1231 Encounter for screening mammogram for malignant neoplasm of breast: Secondary | ICD-10-CM | POA: Diagnosis not present

## 2018-06-16 DIAGNOSIS — R937 Abnormal findings on diagnostic imaging of other parts of musculoskeletal system: Secondary | ICD-10-CM

## 2018-06-16 DIAGNOSIS — E559 Vitamin D deficiency, unspecified: Secondary | ICD-10-CM

## 2018-06-16 MED ORDER — MELOXICAM 7.5 MG PO TABS: 7.5000 mg | ORAL_TABLET | Freq: Two times a day (BID) | ORAL | 2 refills | Status: DC | PRN

## 2018-06-16 NOTE — Progress Notes (Addendum)
Chief Complaint  Patient presents with  . Follow-up    anxiety/ depression   F/u  1. Left hand cat bite improved with doxycycline 2. Abnormal cervical MRI with neck pain +arthritis changes and spinal stenosis moderate and ~ 5 cm lipoma w/o mass effect. She reports she has tried PT in the past w/o longterm relief and wants to see a specialist through [facility]. She has otc ibuprofen at home but wonders if mobic would be better on her stomach.   1. 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 2. Multifactorial degenerative changes with resultant multilevel foraminal narrowing as above. Notable findings include severe left with moderate right C4 and C5 foraminal stenosis, moderate bilateral C6 foraminal narrowing, with moderate left C7 foraminal stenosis. 3. Prominent multilevel facet arthrosis as above, which could contribute to underlying neck pain. Changes most pronounced on the right at C2-3, and on the left at C3-4 and C7-T1. 3. Wax in b/l ears she is having trouble hearing at times. She used debrox ear drops a couple of times since her last visit    Review of Systems  Constitutional: Negative for weight loss.  HENT: Positive for hearing loss.   Eyes: Negative for blurred vision.  Respiratory: Negative for shortness of breath.   Cardiovascular: Negative for chest pain.  Gastrointestinal: Negative for abdominal pain.  Musculoskeletal: Positive for neck pain.  Neurological: Negative for headaches.  Psychiatric/Behavioral: Negative for depression.   Past Medical History:  Diagnosis Date  . Actinic keratoses   . Anxiety   . Arthritis   . Cat bite    05/07/18  . Chicken pox   . Colon polyps    2014   . Depression   . Diverticulitis   . IBS (irritable bowel syndrome)    constipation  . Vitamin D deficiency    Past Surgical History:  Procedure Laterality Date  . ABDOMINAL HYSTERECTOMY     2007 fibroids   .  CHOLECYSTECTOMY     2016   Family History  Problem Relation Age of Onset  . Arthritis Mother   . Depression Mother   . Hearing loss Mother   . Hypertension Mother   . Asthma Father   . Cancer Father        kidney cancer not a smoker   . Kidney disease Father   . Arthritis Sister   . Hypertension Sister   . Cancer Brother        nmsc skin ca   Social History   Socioeconomic History  . Marital status: Married    Spouse name: Not on file  . Number of children: Not on file  . Years of education: Not on file  . Highest education level: Not on file  Occupational History  . Not on file  Social Needs  . Financial resource strain: Not on file  . Food insecurity:    Worry: Not on file    Inability: Not on file  . Transportation needs:    Medical: Not on file    Non-medical: Not on file  Tobacco Use  . Smoking status: Never Smoker  . Smokeless tobacco: Never Used  . Tobacco comment: light smoker short duration   Substance and Sexual Activity  . Alcohol use: Yes    Comment: occassionally  . Drug use: Not Currently  . Sexual activity: Not Currently    Comment: men  Lifestyle  . Physical activity:    Days per week: Not on file  Minutes per session: Not on file  . Stress: Not on file  Relationships  . Social connections:    Talks on phone: Not on file    Gets together: Not on file    Attends religious service: Not on file    Active member of club or organization: Not on file    Attends meetings of clubs or organizations: Not on file    Relationship status: Not on file  . Intimate partner violence:    Fear of current or ex partner: Not on file    Emotionally abused: Not on file    Physically abused: Not on file    Forced sexual activity: Not on file  Other Topics Concern  . Not on file  Social History Narrative   Divorced    1. 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  buPROPion (WELLBUTRIN XL) 300 MG 24 hr tablet Take 1 tablet (300 mg total) by mouth daily.  Marland Kitchen estradiol (ESTRACE) 0.5 MG tablet Take 1 tablet (0.5 mg total) by mouth daily.  Marland Kitchen FLUoxetine (PROZAC) 40 MG capsule Take 1 capsule (40 mg total) by mouth daily.  Marland Kitchen gabapentin (NEURONTIN) 400 MG capsule Take 400 mg by mouth 4 (four) times daily.   Marland Kitchen linaclotide (LINZESS) 290 MCG CAPS capsule Take 290 mcg by mouth daily before breakfast.  . MELATONIN PO Take 0.5 mg by mouth daily.   Allergies  Allergen Reactions  . Oxycodone-Acetaminophen     Other reaction(s): Other sick  . Penicillins     Numbness in fingers child     Recent Results (from the past 2160 hour(s))  Hepatitis C antibody     Status: None   Collection Time: 03/25/18  9:50 AM  Result Value Ref Range   Hepatitis C Ab NON-REACTIVE NON-REACTI   SIGNAL TO CUT-OFF 0.01 <1.00    Comment: . HCV antibody was non-reactive. There is no laboratory  evidence of HCV infection. . In most cases, no further action is required. However, if recent HCV exposure is suspected, a test for HCV RNA (test code [phone]) is suggested. . For additional information please refer to http://education.questdiagnostics.com/faq/FAQ22v1 (This link is being provided for informational/ educational purposes only.) .   Vitamin D (25 hydroxy)     Status: Abnormal   Collection Time: 03/25/18  9:50 AM  Result Value Ref Range   VITD 20.22 (L) 30.00 - 100.00 ng/mL  Urinalysis, Routine w reflex microscopic     Status: None   Collection Time: 03/25/18  9:50 AM  Result Value Ref Range   Color, Urine YELLOW YELLOW   APPearance CLEAR CLEAR   Specific Gravity, Urine 1.018 1.001 - 1.03   pH 6.5 5.0 - 8.0   Glucose, UA NEGATIVE NEGATIVE   Bilirubin Urine NEGATIVE NEGATIVE   Ketones, ur NEGATIVE NEGATIVE   Hgb urine dipstick NEGATIVE NEGATIVE   Protein, ur NEGATIVE NEGATIVE   Nitrite NEGATIVE NEGATIVE   Leukocytes, UA NEGATIVE NEGATIVE  T4, free     Status: None    Collection Time: 03/25/18  9:50 AM  Result Value Ref Range   Free T4 0.70 0.60 - 1.60 ng/dL    Comment: Specimens from patients who are undergoing biotin therapy and /or ingesting biotin supplements may contain high levels of biotin.  The higher biotin concentration in these specimens interferes with  this Free T4 assay.  Specimens that contain high levels  of biotin may cause false high results for this Free T4 assay.  Please interpret results in light of the total clinical presentation of the patient.    TSH     Status: None   Collection Time: 03/25/18  9:50 AM  Result Value Ref Range   TSH 4.17 0.35 - 4.50 uIU/mL  Lipid panel     Status: Abnormal   Collection Time: 03/25/18  9:50 AM  Result Value Ref Range   Cholesterol 183 0 - 200 mg/dL    Comment: ATP III Classification       Desirable:  < 200 mg/dL               Borderline High:  200 - 239 mg/dL          High:  > = 240 mg/dL   Triglycerides 159.0 (H) 0.0 - 149.0 mg/dL    Comment: Normal:  <150 mg/dLBorderline High:  150 - 199 mg/dL   HDL 76.70 >39.00 mg/dL   VLDL 31.8 0.0 - 40.0 mg/dL   LDL Cholesterol 75 0 - 99 mg/dL   Total CHOL/HDL Ratio 2     Comment:                Men          Women1/2 Average Risk     3.4          3.3Average Risk          5.0          4.42X Average Risk          9.6          7.13X Average Risk          15.0          11.0                       NonHDL 106.58     Comment: NOTE:  Non-HDL goal should be 30 mg/dL higher than patient's LDL goal (i.e. LDL goal of < 70 mg/dL, would have non-HDL goal of < 100 mg/dL)  CBC with Differential/Platelet     Status: None   Collection Time: 03/25/18  9:50 AM  Result Value Ref Range   WBC 4.4 4.0 - 10.5 K/uL   RBC 4.25 3.87 - 5.11 Mil/uL   Hemoglobin 12.8 12.0 - 15.0 g/dL   HCT 38.2 36.0 - 46.0 %   MCV 89.8 78.0 - 100.0 fl   MCHC 33.6 30.0 - 36.0 g/dL   RDW 12.6 11.5 - 15.5 %   Platelets 325.0 150.0 - 400.0 K/uL   Neutrophils Relative % 58.9 43.0 - 77.0 %   Lymphocytes  Relative 29.1 12.0 - 46.0 %   Monocytes Relative 9.3 3.0 - 12.0 %   Eosinophils Relative 1.7 0.0 - 5.0 %   Basophils Relative 1.0 0.0 - 3.0 %   Neutro Abs 2.6 1.4 - 7.7 K/uL   Lymphs Abs 1.3 0.7 - 4.0 K/uL   Monocytes Absolute 0.4 0.1 - 1.0 K/uL   Eosinophils Absolute 0.1 0.0 - 0.7 K/uL   Basophils Absolute 0.0 0.0 - 0.1 K/uL  Comprehensive metabolic panel     Status: None   Collection Time: 03/25/18  9:50 AM  Result Value Ref Range   Sodium 137 135 - 145 mEq/L   Potassium 4.0 3.5 - 5.1 mEq/L   Chloride 103 96 - 112 mEq/L  CO2 25 19 - 32 mEq/L   Glucose, Bld 83 70 - 99 mg/dL   BUN 19 6 - 23 mg/dL   Creatinine, Ser 0.80 0.40 - 1.20 mg/dL   Total Bilirubin 0.4 0.2 - 1.2 mg/dL   Alkaline Phosphatase 60 39 - 117 U/L   AST 21 0 - 37 U/L   ALT 21 0 - 35 U/L   Total Protein 6.7 6.0 - 8.3 g/dL   Albumin 4.3 3.5 - 5.2 g/dL   Calcium 9.1 8.4 - 10.5 mg/dL   GFR 77.74 >60.00 mL/min   Objective  Body mass index is 21.44 kg/m. Wt Readings from Last 3 Encounters:  06/16/18 117 lb 3.2 oz (53.2 kg)  05/08/18 117 lb 6 oz (53.2 kg)  03/17/18 116 lb 6.4 oz (52.8 kg)   Temp Readings from Last 3 Encounters:  06/16/18 99 F (37.2 C) (Oral)  05/08/18 98.1 F (36.7 C) (Oral)  03/17/18 98.5 F (36.9 C) (Oral)   BP Readings from Last 3 Encounters:  06/16/18 (!) 126/92  05/08/18 132/90  03/17/18 136/76   Pulse Readings from Last 3 Encounters:  06/16/18 75  05/08/18 71  03/17/18 70    Physical Exam Vitals signs and nursing note reviewed.  Constitutional:      Appearance: Normal appearance.  HENT:     Head: Normocephalic and atraumatic.     Ears:     Comments: Cerumen impaction b/l ears      Mouth/Throat:     Mouth: Mucous membranes are moist.     Pharynx: Oropharynx is clear.  Eyes:     Conjunctiva/sclera: Conjunctivae normal.     Pupils: Pupils are equal, round, and reactive to light.  Cardiovascular:     Rate and Rhythm: Normal rate and regular rhythm.     Heart sounds:  Normal heart sounds.  Pulmonary:     Effort: Pulmonary effort is normal.     Breath sounds: Normal breath sounds.  Skin:    General: Skin is warm and dry.  Neurological:     General: No focal deficit present.     Mental Status: She is alert and oriented to person, place, and time. Mental status is at baseline.     Gait: Gait normal.  Psychiatric:        Attention and Perception: Attention and perception normal.        Mood and Affect: Mood and affect normal.        Speech: Speech normal.        Behavior: Behavior normal. Behavior is cooperative.        Thought Content: Thought content normal.        Cognition and Memory: Cognition and memory normal.        Judgment: Judgment normal.     Assessment   1. Left hand cat bite improved  2. Abnormal cervical MRI and cervicalgia  3. Cerumen impaction b/l ears  4. HM Plan   1. ntd  2. Refer to [facility] Dr. Smitty Cords, Dr. Lyla Son, Dr. 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 Francesco Runner rec PT referred to Dr. Katherine Roan for NS eval. BP was 147/80  08/18/2018 Dr. Susanne Borders NP) saw [facility] rec pt did not want surgery office does not do FMLA unless undergoing surgery but given work note from 8 hrs to 5-6 hrs daily to avoid cervical strain  f/u in 1 month. Mobic is helping PT/LESIs/botox injections Dr. Mariella Saa helped.   3. currette able to get wax out of right ear  Lavage wax out of left ear  Prn Debrox rec at least monthly  4.  Flu shot utd  Disc shingrix vaccine prev   Tdap had 06/01/12   Get records mammogram former [facility] [place], EGD/colonoscopy (h/o polpys 2014 due repeat 5 years pt declines for now -08/06/17 mammogram neg dense breasts. mammo sch 08/2018 [facility]   S/p hysterectomy for fibroids 2007 no h/o abnormal pap  Former light smoker not long term  H/o [facility] dermatology  saw last years no need for now consider in future Colonoscopy disc at f/u had 5 years ago 1 polyp ? Adenoma pt to think about disc today 06/16/18   - former PCP Dr. Lourena Simmonds in [place] Vit D def rec 5000 IU D3 daily    09/10/18 [facility] chronic neck pain rec exercises [employer] paperwork and re-iterated office does not do FMLA unless undergoing surgical intervention f/u in 1 month  Provider: Dr. Olivia Mackie McLean-Scocuzza-[facility]

## 2018-06-16 NOTE — Patient Instructions (Addendum)
D3 5000 IU daily  We will refer to [facility] about your back  Mammogram due 08/2018   Vitamin D Deficiency Vitamin D deficiency is when your body does not have enough vitamin D. Vitamin D is important to your body for many reasons:  It helps the body to absorb two important minerals, called calcium and phosphorus.  It plays a role in bone health.  It may help to prevent some diseases, such as diabetes and multiple sclerosis.  It plays a role in muscle function, including heart function.  You can get vitamin D by:  Eating foods that naturally contain vitamin D.  Eating or drinking milk or other dairy products that have vitamin D added to them.  Taking a vitamin D supplement or a multivitamin supplement that contains vitamin D.  Being in the sun. Your body naturally makes vitamin D when your skin is exposed to sunlight. Your body changes the sunlight into a form of the vitamin that the body can use.  If vitamin D deficiency is severe, it can cause a condition in which your bones become soft. In adults, this condition is called osteomalacia. In children, this condition is called rickets. What are the causes? Vitamin D deficiency may be caused by:  Not eating enough foods that contain vitamin D.  Not getting enough sun exposure.  Having certain digestive system diseases that make it difficult for your body to absorb vitamin D. These diseases include Crohn disease, chronic pancreatitis, and cystic fibrosis.  Having a surgery in which a part of the stomach or a part of the small intestine is removed.  Being obese.  Having chronic kidney disease or liver disease.  What increases the risk? This condition is more likely to develop in:  Older people.  People who do not spend much time outdoors.  People who live in a long-term care facility.  People who have had broken bones.  People with weak or thin bones (osteoporosis).  People who have a disease or condition  that changes how the body absorbs vitamin D.  People who have dark skin.  People who take certain medicines, such as steroid medicines or certain seizure medicines.  People who are overweight or obese.  What are the signs or symptoms? In mild cases of vitamin D deficiency, there may not be any symptoms. If the condition is severe, symptoms may include:  Bone pain.  Muscle pain.  Falling often.  Broken bones caused by a minor injury.  How is this diagnosed? This condition is usually diagnosed with a blood test. How is this treated? Treatment for this condition may depend on what caused the condition. Treatment options include:  Taking vitamin D supplements.  Taking a calcium supplement. Your health care provider will suggest what dose is best for you.  Follow these instructions at home:  Take medicines and supplements only as told by your health care provider.  Eat foods that contain vitamin D. Choices include: ? Fortified dairy products, cereals, or juices. Fortified means that vitamin D has been added to the food. Check the label on the package to be sure. ? Fatty fish, such as salmon or trout. ? Eggs. ? Oysters.  Do not use a tanning bed.  Maintain a healthy weight. Lose weight, if needed.  Keep all follow-up visits as told by your health care provider. This is important. Contact a health care provider if:  Your symptoms do not go away.  You feel like throwing up (nausea) or you  throw up (vomit).  You have fewer bowel movements than usual or it is difficult for you to have a bowel movement (constipation). This information is not intended to replace advice given to you by your health care provider. Make sure you discuss any questions you have with your health care provider. Document Released: 09/09/2011 Document Revised: 11/29/2015 Document Reviewed: 11/02/2014 Elsevier Interactive Patient Education  2018 [employer].

## 2018-07-03 ENCOUNTER — Other Ambulatory Visit: Payer: Self-pay | Admitting: Internal Medicine

## 2018-07-03 NOTE — Telephone Encounter (Signed)
Copied from [place] [id number]. Topic: Quick Communication - Rx Refill/Question >> Jul 03, 2018  8:40 AM Reyne Dumas L wrote: Medication: gabapentin (NEURONTIN) 400 MG capsule  Has the patient contacted their pharmacy? Yes - but PCP hasn't filled this medication yet.  Pt states that she is completely out of medication. (Agent: If no, request that the patient contact the pharmacy for the refill.) (Agent: If yes, when and what did the pharmacy advise?)  Preferred Pharmacy (with phone number or street name): [id number] - [place], [place] - [place] [phone] (Phone) [phone] (Fax)  Agent: Please be advised that RX refills may take up to 3 business days. We ask that you follow-up with your pharmacy.

## 2018-07-03 NOTE — Telephone Encounter (Signed)
Requested medication (s) are due for refill today: yes  Requested medication (s) are on the active medication list: yes  Last refill:  03/17/18- historical med  Future visit scheduled: yes  Notes to clinic:  Med is a historical provider   Requested Prescriptions  Pending Prescriptions Disp Refills   gabapentin (NEURONTIN) 400 MG capsule 120 capsule 2    Sig: Take 1 capsule (400 mg total) by mouth 4 (four) times daily.     Neurology: Anticonvulsants - gabapentin Passed - 07/03/2018 11:56 AM      Passed - Valid encounter within last 12 months    Recent Outpatient Visits          2 weeks ago [place] McLean-Scocuzza, Nino Glow, MD   1 month ago Cat bite, initial encounter   [place] McLean-Scocuzza, Nino Glow, MD   3 months ago Anxiety and depression   [facility] McLean-Scocuzza, Nino Glow, MD      Future Appointments            In 5 months McLean-Scocuzza, Nino Glow, MD [facility]

## 2018-07-06 MED ORDER — GABAPENTIN 400 MG PO CAPS: 400.0000 mg | ORAL_CAPSULE | Freq: Four times a day (QID) | ORAL | 2 refills | Status: DC

## 2018-07-30 ENCOUNTER — Other Ambulatory Visit: Payer: Self-pay | Admitting: Internal Medicine

## 2018-07-30 ENCOUNTER — Telehealth: Payer: Self-pay | Admitting: Internal Medicine

## 2018-07-30 DIAGNOSIS — K581 Irritable bowel syndrome with constipation: Secondary | ICD-10-CM

## 2018-07-30 MED ORDER — LINACLOTIDE 290 MCG PO CAPS: 290.0000 ug | ORAL_CAPSULE | Freq: Every day | ORAL | 3 refills | Status: DC

## 2018-07-30 NOTE — Telephone Encounter (Signed)
Why does she need FMLA paperwork?  If this is about her neck/back she needs to have [facility] fill this out for her What is the reason for paperwork?   [place]

## 2018-07-30 NOTE — Telephone Encounter (Signed)
Pt needs a refill on Linoclotide(Linzess). Would like it sent to [facility].

## 2018-07-30 NOTE — Telephone Encounter (Signed)
Pt dropped off FMLA papers to be completed. Papers are up front in color folder. Pt needs these faxed out by Feb 4th.

## 2018-07-30 NOTE — Telephone Encounter (Signed)
Paperwork placed on provider desk 

## 2018-07-31 NOTE — Telephone Encounter (Signed)
Spoken to patient. She was informed to bring paperwork to PCP from [facility].

## 2018-08-03 NOTE — Telephone Encounter (Signed)
Pt called in and wanted to check the status of the FMLA paper work.  She stated it has to be completed and faxed by tomorrow.    Per pt all info to sent was on the paperwork Best number  [phone]

## 2018-08-04 DIAGNOSIS — Z0279 Encounter for issue of other medical certificate: Secondary | ICD-10-CM

## 2018-08-04 NOTE — Telephone Encounter (Signed)
Paper work has been faxed

## 2018-08-04 NOTE — Telephone Encounter (Signed)
Paperwork ready to be picked up   [place]

## 2018-08-07 ENCOUNTER — Telehealth: Payer: Self-pay | Admitting: Internal Medicine

## 2018-08-07 NOTE — Telephone Encounter (Signed)
I will speak with [facility] and then determine how much time is needed and fill in    Jamie Salas

## 2018-08-07 NOTE — Telephone Encounter (Signed)
FYI

## 2018-08-07 NOTE — Telephone Encounter (Signed)
Pt had FMLA paper work filled out. On the page for amount of leave needed question #4 needs to be completed. Pt needs Dr. Marigene Ehlers to fill in work 4 to 5 days a week, 5 to 6 hours per day. Initial and date change on that line and then fax. Paper work is up front in Dr. Gentry Roch color folder. Pt would like a copy of this when completed.

## 2018-08-11 ENCOUNTER — Ambulatory Visit
Admission: RE | Admit: 2018-08-11 | Discharge: 2018-08-11 | Disposition: A | Discharge status: Home / Self Care | Payer: Managed Care, Other (non HMO) | Source: Ambulatory Visit | Attending: Internal Medicine | Admitting: Internal Medicine

## 2018-08-11 ENCOUNTER — Encounter: Payer: Self-pay | Admitting: Radiology

## 2018-08-11 ENCOUNTER — Other Ambulatory Visit: Payer: Self-pay | Admitting: Internal Medicine

## 2018-08-11 DIAGNOSIS — R928 Other abnormal and inconclusive findings on diagnostic imaging of breast: Secondary | ICD-10-CM

## 2018-08-11 DIAGNOSIS — Z1231 Encounter for screening mammogram for malignant neoplasm of breast: Secondary | ICD-10-CM | POA: Insufficient documentation

## 2018-08-11 DIAGNOSIS — N6320 Unspecified lump in the left breast, unspecified quadrant: Secondary | ICD-10-CM

## 2018-08-17 ENCOUNTER — Telehealth: Payer: Self-pay | Admitting: Internal Medicine

## 2018-08-17 NOTE — Telephone Encounter (Signed)
Paperwork ready to be re-faxed call pt  Further work restrictions to be determined by neurosurgery as sports medicine does not deem work restrictions needed inform pt  Jamie Salas

## 2018-08-20 ENCOUNTER — Ambulatory Visit
Admission: RE | Admit: 2018-08-20 | Discharge: 2018-08-20 | Disposition: A | Discharge status: Home / Self Care | Payer: Managed Care, Other (non HMO) | Source: Ambulatory Visit | Attending: Internal Medicine | Admitting: Internal Medicine

## 2018-08-20 DIAGNOSIS — R928 Other abnormal and inconclusive findings on diagnostic imaging of breast: Secondary | ICD-10-CM | POA: Diagnosis present

## 2018-08-20 DIAGNOSIS — N6320 Unspecified lump in the left breast, unspecified quadrant: Secondary | ICD-10-CM

## 2018-08-20 NOTE — Telephone Encounter (Signed)
FMLA paperwork needs clarification. Sending over by fax

## 2018-10-08 ENCOUNTER — Other Ambulatory Visit: Payer: Self-pay | Admitting: Internal Medicine

## 2018-10-08 DIAGNOSIS — M5412 Radiculopathy, cervical region: Secondary | ICD-10-CM

## 2018-10-08 MED ORDER — MELOXICAM 7.5 MG PO TABS: 7.5000 mg | ORAL_TABLET | Freq: Two times a day (BID) | ORAL | 5 refills | Status: DC | PRN

## 2018-10-14 ENCOUNTER — Other Ambulatory Visit: Payer: Self-pay | Admitting: Internal Medicine

## 2018-10-14 DIAGNOSIS — K581 Irritable bowel syndrome with constipation: Secondary | ICD-10-CM

## 2018-10-15 ENCOUNTER — Other Ambulatory Visit: Payer: Self-pay | Admitting: Internal Medicine

## 2018-10-15 DIAGNOSIS — M5412 Radiculopathy, cervical region: Secondary | ICD-10-CM

## 2018-10-15 MED ORDER — GABAPENTIN 400 MG PO CAPS: 400.0000 mg | ORAL_CAPSULE | Freq: Four times a day (QID) | ORAL | 6 refills | Status: DC

## 2018-11-09 ENCOUNTER — Telehealth: Payer: Self-pay | Admitting: Internal Medicine

## 2018-11-09 NOTE — Telephone Encounter (Signed)
Copied from [place]. Topic: General - Other >> Nov 09, 2018  1:52 PM Rayann Heman wrote: Reason for CRM: linaclotide Rolan Lipa) 290 MCG CAPS capsule [[id number]]  pt states that she needs a PA. Please advise

## 2018-11-20 NOTE — Telephone Encounter (Signed)
A PA was done today for cover my meds and we are waiting for a response.  Nina,cma

## 2018-11-24 ENCOUNTER — Other Ambulatory Visit: Payer: Self-pay | Admitting: Internal Medicine

## 2018-11-24 DIAGNOSIS — F419 Anxiety disorder, unspecified: Secondary | ICD-10-CM

## 2018-11-24 DIAGNOSIS — F329 Major depressive disorder, single episode, unspecified: Secondary | ICD-10-CM

## 2018-11-24 DIAGNOSIS — Z780 Asymptomatic menopausal state: Secondary | ICD-10-CM

## 2018-11-24 MED ORDER — FLUOXETINE HCL 40 MG PO CAPS: 40.0000 mg | ORAL_CAPSULE | Freq: Every day | ORAL | 2 refills | Status: DC

## 2018-11-24 MED ORDER — BUPROPION HCL ER (XL) 300 MG PO TB24: 300.0000 mg | ORAL_TABLET | Freq: Every day | ORAL | 2 refills | Status: DC

## 2018-11-24 MED ORDER — ESTRADIOL 0.5 MG PO TABS: 0.5000 mg | ORAL_TABLET | Freq: Every day | ORAL | 2 refills | Status: DC

## 2018-11-26 NOTE — Telephone Encounter (Signed)
Pt calling to check status. Pt stated that she would like to do something else if this is not approved   [phone]

## 2018-11-26 NOTE — Telephone Encounter (Signed)
Jamie Salas due you think you can try to get this linzess approved?

## 2018-11-26 NOTE — Telephone Encounter (Signed)
Please call the patient and let her know or have Juliann Pulse try to pre approve this linzess  [place]

## 2018-11-26 NOTE — Telephone Encounter (Signed)
It has not been approved

## 2018-11-27 NOTE — Telephone Encounter (Signed)
Called patient to clarify drug coverage need RXBIN number, group number and PCN. PEC may advise.  Name of insurance.

## 2018-11-30 NOTE — Telephone Encounter (Signed)
Per brock this has been approved.

## 2018-11-30 NOTE — Telephone Encounter (Signed)
Brought card into office on Friday.

## 2018-12-01 ENCOUNTER — Other Ambulatory Visit: Payer: Self-pay | Admitting: Internal Medicine

## 2018-12-01 DIAGNOSIS — F329 Major depressive disorder, single episode, unspecified: Secondary | ICD-10-CM

## 2018-12-01 DIAGNOSIS — Z780 Asymptomatic menopausal state: Secondary | ICD-10-CM

## 2018-12-01 DIAGNOSIS — F419 Anxiety disorder, unspecified: Secondary | ICD-10-CM

## 2018-12-01 MED ORDER — BUPROPION HCL ER (XL) 300 MG PO TB24: 300.0000 mg | ORAL_TABLET | Freq: Every day | ORAL | 2 refills | Status: DC

## 2018-12-01 MED ORDER — FLUOXETINE HCL 40 MG PO CAPS: 40.0000 mg | ORAL_CAPSULE | Freq: Every day | ORAL | 2 refills | Status: DC

## 2018-12-01 MED ORDER — ESTRADIOL 0.5 MG PO TABS: 0.5000 mg | ORAL_TABLET | Freq: Every day | ORAL | 2 refills | Status: DC

## 2018-12-16 ENCOUNTER — Ambulatory Visit: Payer: Managed Care, Other (non HMO) | Admitting: Internal Medicine

## 2019-01-21 ENCOUNTER — Telehealth: Payer: Self-pay

## 2019-01-21 ENCOUNTER — Other Ambulatory Visit: Payer: Self-pay | Admitting: Internal Medicine

## 2019-01-21 DIAGNOSIS — R928 Other abnormal and inconclusive findings on diagnostic imaging of breast: Secondary | ICD-10-CM

## 2019-01-21 NOTE — Telephone Encounter (Signed)
Copied from [place] [id number]. Topic: Referral - Request for Referral >> Jan 21, 2019 12:28 PM Scherrie Gerlach wrote: Pt requesting new referral for diagnostic mammogram to be sent to [facility]. This is a 6 mo follow up she was to do from the mammogram done in Feb when they found the spot. [facility] needs new referral

## 2019-01-21 NOTE — Telephone Encounter (Signed)
Order in mammogram [facility] due 02/18/19   [place]

## 2019-02-18 ENCOUNTER — Ambulatory Visit
Admission: RE | Admit: 2019-02-18 | Discharge: 2019-02-18 | Disposition: A | Discharge status: Home / Self Care | Payer: Managed Care, Other (non HMO) | Source: Ambulatory Visit | Attending: Internal Medicine | Admitting: Internal Medicine

## 2019-02-18 ENCOUNTER — Telehealth: Payer: Self-pay | Admitting: Internal Medicine

## 2019-02-18 DIAGNOSIS — R928 Other abnormal and inconclusive findings on diagnostic imaging of breast: Secondary | ICD-10-CM

## 2019-02-18 NOTE — Telephone Encounter (Signed)
Pt dropped off forms needing to be filled out. Its in color folder up front. Thank you!

## 2019-02-19 ENCOUNTER — Encounter: Payer: Self-pay | Admitting: Internal Medicine

## 2019-02-19 ENCOUNTER — Other Ambulatory Visit: Payer: Self-pay

## 2019-02-19 ENCOUNTER — Ambulatory Visit (INDEPENDENT_AMBULATORY_CARE_PROVIDER_SITE_OTHER): Payer: Managed Care, Other (non HMO) | Admitting: Internal Medicine

## 2019-02-19 VITALS — Ht 62.0 in | Wt 117.2 lb

## 2019-02-19 DIAGNOSIS — Z1329 Encounter for screening for other suspected endocrine disorder: Secondary | ICD-10-CM

## 2019-02-19 DIAGNOSIS — Z1211 Encounter for screening for malignant neoplasm of colon: Secondary | ICD-10-CM

## 2019-02-19 DIAGNOSIS — Z13220 Encounter for screening for lipoid disorders: Secondary | ICD-10-CM

## 2019-02-19 DIAGNOSIS — Z0279 Encounter for issue of other medical certificate: Secondary | ICD-10-CM

## 2019-02-19 DIAGNOSIS — E559 Vitamin D deficiency, unspecified: Secondary | ICD-10-CM

## 2019-02-19 DIAGNOSIS — Z1389 Encounter for screening for other disorder: Secondary | ICD-10-CM | POA: Diagnosis not present

## 2019-02-19 DIAGNOSIS — N6320 Unspecified lump in the left breast, unspecified quadrant: Secondary | ICD-10-CM

## 2019-02-19 DIAGNOSIS — Z1231 Encounter for screening mammogram for malignant neoplasm of breast: Secondary | ICD-10-CM

## 2019-02-19 DIAGNOSIS — R937 Abnormal findings on diagnostic imaging of other parts of musculoskeletal system: Secondary | ICD-10-CM

## 2019-02-19 DIAGNOSIS — Z86010 Personal history of colonic polyps: Secondary | ICD-10-CM

## 2019-02-19 DIAGNOSIS — Z0000 Encounter for general adult medical examination without abnormal findings: Secondary | ICD-10-CM

## 2019-02-19 DIAGNOSIS — M5412 Radiculopathy, cervical region: Secondary | ICD-10-CM

## 2019-02-19 NOTE — Telephone Encounter (Signed)
Placed on providers desk

## 2019-02-19 NOTE — Progress Notes (Signed)
Virtual Visit via Video Note  I connected with Jamie Salas  on 02/19/19 at 10:30 AM EDT by a video enabled telemedicine application and verified that I am speaking with the correct person using two identifiers.  Location patient: home Location provider:work or home office Persons participating in the virtual visit: patient, provider  I discussed the limitations of evaluation and management by telemedicine and the availability of in person appointments. The patient expressed understanding and agreed to proceed.   HPI: 1. 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 Needs paperwork filled out fmla for work as well    ROS: See pertinent positives and negatives per HPI. General: no wt change  HEENT: no sore throat CV: no chest pain  Lungs: no sob  GI: no ab pain  GU: no issues  MSK: chronic neck pain  Skin: no issues  Neuro: no h/a  Psych: no anxiety/depression  Past Medical History:  Diagnosis Date  . Actinic keratoses   . Anxiety   . Arthritis   . Cat bite    05/07/18  . Chicken pox   . Colon polyps    2014   . Depression   . Diverticulitis   . IBS (irritable bowel syndrome)    constipation  . Vitamin D deficiency     Past Surgical History:  Procedure Laterality Date  . ABDOMINAL HYSTERECTOMY     2007 fibroids   . CATARACT EXTRACTION    . CHOLECYSTECTOMY     2016    Family History  Problem Relation Age of Onset  . Arthritis Mother   . Depression Mother   . Hearing loss Mother   . Hypertension Mother   . Asthma Father   . Cancer Father        kidney cancer not a smoker   . Kidney disease Father   . Arthritis Sister   . Hypertension Sister   . Cancer Brother        nmsc skin ca  . 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  buPROPion (WELLBUTRIN XL) 300 MG 24 hr tablet, Take 1 tablet (300 mg total) by mouth daily., Disp: 90 tablet, Rfl: 2 .  Cholecalciferol (VITAMIN D3) 50 MCG (2000 UT) TABS, Take by mouth., Disp: , Rfl:  .  estradiol (ESTRACE) 0.5 MG tablet, Take 1 tablet (0.5 mg total) by mouth daily., Disp: 90 tablet, Rfl: 2 .  FLUoxetine (PROZAC) 40 MG capsule, Take 1 capsule (40 mg total) by mouth daily., Disp: 90 capsule, Rfl: 2 .  gabapentin (NEURONTIN) 400 MG capsule, Take 1 capsule (400 mg total) by mouth 4 (four) times daily., Disp: 120 capsule, Rfl: 6 .  linaclotide (LINZESS) 290 MCG CAPS capsule, Take 1 capsule (290 mcg total) by mouth daily before breakfast., Disp: 90 capsule, Rfl: 3 .  MELATONIN PO, Take 0.5 mg by mouth daily., Disp: , Rfl:  .  meloxicam (MOBIC) 7.5 MG tablet, Take 1 tablet (7.5 mg total) by mouth 2 (two) times daily as needed for pain., Disp: 60 tablet, Rfl: 5 .  naproxen sodium (ALEVE) 220 MG tablet, Take 220 mg by mouth. As needed.  PM, Disp: , Rfl:   EXAM:  VITALS per patient if applicable:  GENERAL: alert, oriented, appears well and in no acute distress  HEENT: atraumatic, conjunttiva clear, no obvious abnormalities on inspection of external nose and ears  NECK: normal movements of the head and neck  LUNGS: on inspection no signs of respiratory distress, breathing rate appears normal, no obvious gross SOB, gasping or wheezing  CV: no obvious cyanosis  MS: moves all visible extremities without noticeable abnormality  PSYCH/NEURO: pleasant and cooperative, no obvious depression or anxiety, speech and thought processing grossly intact  ASSESSMENT AND PLAN:  Discussed the following assessment and plan:  Annual physical exam - Plan: Comprehensive metabolic panel, CBC w/Diff  sch fasting labs 03/26/19 with flu shot  Flushot wants upcoming Disc shingrix vaccine prev and again today pt wants to get in the future  here rec 2 doses  Tdap had 06/01/12  Get records mammogram former [facility] , EGD/colonoscopy (h/o polpys 2014 due repeat 5 years pt declines for now -08/06/17 mammogram neg dense breasts. mammo 02/18/19 benign mass left breast repeat dx in 6 months left breast and screening right   S/p hysterectomy for fibroids 2007 no h/o abnormal pap  Former light smoker not long term  H/o Aks dermatology saw last years no need for now as of 02/19/19 consider in future Colonoscopy disc at f/uhad 5 years ago 1 polyp ? Adenoma pt to think about disc today 06/16/18  -referred today Dr. Allen Norris   - former PCP Dr. Lourena Simmonds in [place] Vit D def rec 5000 IU D3 daily   chronic neck pain with radiculopathy  Filled our FMLA paperwork today   09/10/18 [employer] chronic neck pain rec exercises [employer] paperwork and re-iterated office does not do FMLA unless undergoing surgical intervention f/u in 1 month She declines botox or surgery at this time     I discussed the assessment and treatment plan with the patient. The patient was provided an opportunity to ask questions and all were answered. The patient agreed with the plan and demonstrated an understanding of the instructions.   The patient was advised to call back or seek an in-person evaluation if the symptoms worsen or if the condition fails to improve as anticipated.  Time spent 20 minutes  Delorise Jackson, MD

## 2019-02-19 NOTE — Progress Notes (Signed)
Patient has already been scheduled a fasting lab appt on 03/12/19.  Patient said that she will go to [facility] to get her flu shot.

## 2019-02-23 ENCOUNTER — Telehealth: Payer: Self-pay | Admitting: Internal Medicine

## 2019-02-23 NOTE — Telephone Encounter (Signed)
Pt is requesting a cream for a yeast infection. Pt was asked to make an appointment, pt declined.

## 2019-02-24 ENCOUNTER — Other Ambulatory Visit: Payer: Self-pay

## 2019-02-24 MED ORDER — FLUCONAZOLE 150 MG PO TABS: 150.0000 mg | ORAL_TABLET | Freq: Once | ORAL | 0 refills | Status: AC

## 2019-02-24 NOTE — Telephone Encounter (Signed)
Pt was seen on 8/21 for physical. Requesting diflucan for yeast infection. Sent in diflucan to pharmacy per Dr. Gayland Curry orders.

## 2019-03-12 ENCOUNTER — Other Ambulatory Visit (INDEPENDENT_AMBULATORY_CARE_PROVIDER_SITE_OTHER): Payer: Managed Care, Other (non HMO)

## 2019-03-12 ENCOUNTER — Other Ambulatory Visit: Payer: Self-pay

## 2019-03-12 DIAGNOSIS — Z0000 Encounter for general adult medical examination without abnormal findings: Secondary | ICD-10-CM

## 2019-03-12 DIAGNOSIS — Z1389 Encounter for screening for other disorder: Secondary | ICD-10-CM

## 2019-03-12 DIAGNOSIS — E559 Vitamin D deficiency, unspecified: Secondary | ICD-10-CM

## 2019-03-12 DIAGNOSIS — Z1329 Encounter for screening for other suspected endocrine disorder: Secondary | ICD-10-CM

## 2019-03-12 DIAGNOSIS — Z13220 Encounter for screening for lipoid disorders: Secondary | ICD-10-CM | POA: Diagnosis not present

## 2019-03-12 LAB — CBC WITH DIFFERENTIAL/PLATELET
Basophils Absolute: 0 10*3/uL (ref 0.0–0.1)
Basophils Relative: 1 % (ref 0.0–3.0)
Eosinophils Absolute: 0.1 10*3/uL (ref 0.0–0.7)
Eosinophils Relative: 2 % (ref 0.0–5.0)
HCT: 39.6 % (ref 36.0–46.0)
Hemoglobin: 13.1 g/dL (ref 12.0–15.0)
Lymphocytes Relative: 33.3 % (ref 12.0–46.0)
Lymphs Abs: 1.4 10*3/uL (ref 0.7–4.0)
MCHC: 33 g/dL (ref 30.0–36.0)
MCV: 93.2 fl (ref 78.0–100.0)
Monocytes Absolute: 0.4 10*3/uL (ref 0.1–1.0)
Monocytes Relative: 9.4 % (ref 3.0–12.0)
Neutro Abs: 2.3 10*3/uL (ref 1.4–7.7)
Neutrophils Relative %: 54.3 % (ref 43.0–77.0)
Platelets: 334 10*3/uL (ref 150.0–400.0)
RBC: 4.25 Mil/uL (ref 3.87–5.11)
RDW: 12.7 % (ref 11.5–15.5)
WBC: 4.2 10*3/uL (ref 4.0–10.5)

## 2019-03-12 LAB — COMPREHENSIVE METABOLIC PANEL
ALT: 16 U/L (ref 0–35)
AST: 19 U/L (ref 0–37)
Albumin: 4.3 g/dL (ref 3.5–5.2)
Alkaline Phosphatase: 58 U/L (ref 39–117)
BUN: 11 mg/dL (ref 6–23)
CO2: 31 mEq/L (ref 19–32)
Calcium: 9.7 mg/dL (ref 8.4–10.5)
Chloride: 98 mEq/L (ref 96–112)
Creatinine, Ser: 0.84 mg/dL (ref 0.40–1.20)
GFR: 68.92 mL/min (ref >60.00)
Glucose, Bld: 79 mg/dL (ref 70–99)
Potassium: 4.3 mEq/L (ref 3.5–5.1)
Sodium: 137 mEq/L (ref 135–145)
Total Bilirubin: 0.6 mg/dL (ref 0.2–1.2)
Total Protein: 6.5 g/dL (ref 6.0–8.3)

## 2019-03-12 LAB — LIPID PANEL
Cholesterol: 233 mg/dL — ABNORMAL HIGH (ref 0–200)
HDL: 97 mg/dL (ref >39.00)
LDL Cholesterol: 119 mg/dL — ABNORMAL HIGH (ref 0–99)
NonHDL: 136.45
Total CHOL/HDL Ratio: 2
Triglycerides: 88 mg/dL (ref 0.0–149.0)
VLDL: 17.6 mg/dL (ref 0.0–40.0)

## 2019-03-12 LAB — TSH: TSH: 5.19 u[IU]/mL — ABNORMAL HIGH (ref 0.35–4.50)

## 2019-03-12 LAB — VITAMIN D 25 HYDROXY (VIT D DEFICIENCY, FRACTURES): VITD: 48.82 ng/mL (ref 30.00–100.00)

## 2019-03-15 ENCOUNTER — Other Ambulatory Visit: Payer: Self-pay | Admitting: Internal Medicine

## 2019-03-15 DIAGNOSIS — R946 Abnormal results of thyroid function studies: Secondary | ICD-10-CM

## 2019-03-26 ENCOUNTER — Other Ambulatory Visit: Payer: Self-pay | Admitting: Internal Medicine

## 2019-03-26 DIAGNOSIS — N6320 Unspecified lump in the left breast, unspecified quadrant: Secondary | ICD-10-CM

## 2019-03-26 DIAGNOSIS — Z1231 Encounter for screening mammogram for malignant neoplasm of breast: Secondary | ICD-10-CM

## 2019-03-30 ENCOUNTER — Encounter: Payer: Self-pay | Admitting: Internal Medicine

## 2019-04-01 ENCOUNTER — Encounter: Payer: Self-pay | Admitting: *Deleted

## 2019-05-13 ENCOUNTER — Telehealth: Payer: Self-pay | Admitting: Gastroenterology

## 2019-05-13 NOTE — Telephone Encounter (Signed)
LVM for pt to call office in regards to scheduling her colonoscopy.    Thanks [employer]

## 2019-05-13 NOTE — Telephone Encounter (Signed)
Pt left vm to schedule a colonoscopy   °

## 2019-05-14 ENCOUNTER — Other Ambulatory Visit: Payer: Self-pay

## 2019-05-14 ENCOUNTER — Telehealth: Payer: Self-pay

## 2019-05-14 DIAGNOSIS — Z86010 Personal history of colonic polyps: Secondary | ICD-10-CM

## 2019-05-14 DIAGNOSIS — Z1211 Encounter for screening for malignant neoplasm of colon: Secondary | ICD-10-CM

## 2019-05-14 DIAGNOSIS — Z8371 Family history of colonic polyps: Secondary | ICD-10-CM

## 2019-05-14 NOTE — Telephone Encounter (Signed)
Gastroenterology Pre-Procedure Review  Request Date: Monday 06/14/19 Requesting Physician: Dr. Allen Norris  PATIENT REVIEW QUESTIONS: The patient responded to the following health history questions as indicated:    1. Are you having any GI issues? yes (IBS Constipation takes [place] office visit.) 2. Do you have a personal history of Polyps? yes (year unknown) 3. Do you have a family history of Colon Cancer or Polyps? yes (brother colon polyps) 4. Diabetes Mellitus? no 5. Joint replacements in the past 12 months?no 6. Major health problems in the past 3 months?no 7. Any artificial heart valves, MVP, or defibrillator?no    MEDICATIONS & ALLERGIES:    Patient reports the following regarding taking any anticoagulation/antiplatelet therapy:   Plavix, Coumadin, Eliquis, Xarelto, Lovenox, Pradaxa, Brilinta, or Effient? no Aspirin? no  Patient confirms/reports the following medications:  Current Outpatient Medications  Medication Sig Dispense Refill  . buPROPion (WELLBUTRIN XL) 300 MG 24 hr tablet Take 1 tablet (300 mg total) by mouth daily. 90 tablet 2  . Cholecalciferol (VITAMIN D3) 50 MCG (2000 UT) TABS Take by mouth.    . estradiol (ESTRACE) 0.5 MG tablet Take 1 tablet (0.5 mg total) by mouth daily. 90 tablet 2  . FLUoxetine (PROZAC) 40 MG capsule Take 1 capsule (40 mg total) by mouth daily. 90 capsule 2  . gabapentin (NEURONTIN) 400 MG capsule Take 1 capsule (400 mg total) by mouth 4 (four) times daily. 120 capsule 6  . linaclotide (LINZESS) 290 MCG CAPS capsule Take 1 capsule (290 mcg total) by mouth daily before breakfast. 90 capsule 3  . MELATONIN PO Take 0.5 mg by mouth daily.    . meloxicam (MOBIC) 7.5 MG tablet Take 1 tablet (7.5 mg total) by mouth 2 (two) times daily as needed for pain. 60 tablet 5  . naproxen sodium (ALEVE) 220 MG tablet Take 220 mg by mouth. As needed.  PM     No current facility-administered medications for this visit.     Patient confirms/reports the  following allergies:  Allergies  Allergen Reactions  . Oxycodone-Acetaminophen     Other reaction(s): Other sick  . Penicillins     Numbness in fingers child      No orders of the defined types were placed in this encounter.   AUTHORIZATION INFORMATION Primary Insurance: 1D#: Group #:  Secondary Insurance: 1D#: Group #:  SCHEDULE INFORMATION: Date: 12/14 Time: Location:[facility]

## 2019-05-17 ENCOUNTER — Telehealth: Payer: Self-pay

## 2019-05-17 NOTE — Telephone Encounter (Signed)
Contacted patient to see if she wanted her colonoscopy with a specific doctor because she was scheduled in error with Dr. Allen Norris on Monday at [facility] and Dr. Allen Norris doesn't scope on Mondays at [facility].  Patient stated she is okay with Dr. Vicente Males doing her colonoscopy on the 14th of December.  Thanks [employer]

## 2019-06-07 ENCOUNTER — Telehealth: Payer: Self-pay | Admitting: Gastroenterology

## 2019-06-07 NOTE — Telephone Encounter (Signed)
Patient wants to cancel colonoscopy on 06-14-19 she will call & r/s next year.

## 2019-06-10 ENCOUNTER — Telehealth: Payer: Self-pay

## 2019-06-10 ENCOUNTER — Other Ambulatory Visit: Admission: RE | Admit: 2019-06-10 | Payer: Managed Care, Other (non HMO) | Source: Ambulatory Visit

## 2019-06-10 NOTE — Telephone Encounter (Signed)
LVM for pt to let her know her procedure has been canceled as requested.  Asked her to call back when she is ready to schedule. Almyra Free in Endo has been informed of cancellation.  Thanks [employer]

## 2019-06-14 ENCOUNTER — Encounter: Admission: RE | Payer: Self-pay | Source: Home / Self Care

## 2019-06-14 ENCOUNTER — Ambulatory Visit
Admission: RE | Admit: 2019-06-14 | Payer: Managed Care, Other (non HMO) | Source: Home / Self Care | Admitting: Gastroenterology

## 2019-06-14 SURGERY — COLONOSCOPY WITH PROPOFOL
Anesthesia: General

## 2019-07-07 ENCOUNTER — Telehealth: Payer: Self-pay | Admitting: Internal Medicine

## 2019-07-07 NOTE — Telephone Encounter (Signed)
err

## 2019-07-09 ENCOUNTER — Ambulatory Visit (INDEPENDENT_AMBULATORY_CARE_PROVIDER_SITE_OTHER): Payer: Managed Care, Other (non HMO) | Admitting: Internal Medicine

## 2019-07-09 ENCOUNTER — Encounter: Payer: Self-pay | Admitting: Internal Medicine

## 2019-07-09 ENCOUNTER — Other Ambulatory Visit: Payer: Self-pay

## 2019-07-09 VITALS — Ht 62.0 in | Wt 106.0 lb

## 2019-07-09 DIAGNOSIS — A6000 Herpesviral infection of urogenital system, unspecified: Secondary | ICD-10-CM

## 2019-07-09 DIAGNOSIS — R946 Abnormal results of thyroid function studies: Secondary | ICD-10-CM

## 2019-07-09 DIAGNOSIS — F329 Major depressive disorder, single episode, unspecified: Secondary | ICD-10-CM

## 2019-07-09 DIAGNOSIS — E785 Hyperlipidemia, unspecified: Secondary | ICD-10-CM | POA: Diagnosis not present

## 2019-07-09 DIAGNOSIS — F419 Anxiety disorder, unspecified: Secondary | ICD-10-CM

## 2019-07-09 DIAGNOSIS — F32A Depression, unspecified: Secondary | ICD-10-CM

## 2019-07-09 MED ORDER — VALACYCLOVIR HCL 500 MG PO TABS: ORAL_TABLET | ORAL | 3 refills | Status: AC

## 2019-07-09 NOTE — Progress Notes (Signed)
Virtual Visit via Video Note  I connected with Jamie Salas  on 07/09/19 at 10:20 AM EST by a video enabled telemedicine application and verified that I am speaking with the correct person using two identifiers.  Location patient: home Location provider:work or home office Persons participating in the virtual visit: patient, provider  I discussed the limitations of evaluation and management by telemedicine and the availability of in person appointments. The patient expressed understanding and agreed to proceed.   HPI: 1. HSV 2 since 38s but w/in the last year getting outbreaks in 1 spot in vaginal region Q2-3 months and had so far 4-5 x  This year prev on valtrex in the past  2. Reviewed labs 01/2019 HLD and thyroid elevated will repeat at f/u in 08/2019  3. Disc covid and shingrix vaccines pt interested in both  4. H/o abnormal mammogram will get 08/13/19 6 month f/u  5. Ab pain at times and bloating gluten free diet tried and helping and she has lost wt down to 105/106 now and reduced bread and past  6. Mood depression on wellbutrin 300 mg xl and prozac 40 mg qd she does not like the winter and thinking of moving to [facility]  She has taken other meds in the past and will let me know the naem when she thinks of them max doses of both meds and inc. Dose could inc. Risk of side effects   ROS: See pertinent positives and negatives per HPI.  Past Medical History:  Diagnosis Date  . Actinic keratoses   . Anxiety   . Arthritis   . Cat bite    05/07/18  . Chicken pox   . Colon polyps    2014   . Depression   . Diverticulitis   . IBS (irritable bowel syndrome)    constipation  . Vitamin D deficiency     Past Surgical History:  Procedure Laterality Date  . ABDOMINAL HYSTERECTOMY     2007 fibroids   . CATARACT EXTRACTION    . CHOLECYSTECTOMY     2016    Family History  Problem Relation Age of Onset  . Arthritis Mother   . Depression Mother   . Hearing loss Mother   . Hypertension  Mother   . Asthma Father   . Cancer Father        kidney cancer not a smoker   . Kidney disease Father   . Arthritis Sister   . Hypertension Sister   . Cancer Brother        nmsc skin ca  . Breast cancer Neg Hx     SOCIAL HX:  Divorced  1. 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  buPROPion (WELLBUTRIN XL) 300 MG 24 hr tablet, Take 1 tablet (300 mg total) by mouth daily., Disp: 90 tablet, Rfl: 2 .  Cholecalciferol (VITAMIN D3) 50 MCG (2000 UT) TABS, Take by mouth., Disp: , Rfl:  .  estradiol (ESTRACE) 0.5 MG tablet, Take 1 tablet (0.5 mg total) by mouth daily., Disp: 90 tablet, Rfl: 2 .  FLUoxetine (PROZAC) 40 MG capsule, Take 1 capsule (40 mg total) by mouth daily., Disp: 90 capsule, Rfl: 2 .  gabapentin (NEURONTIN) 400 MG capsule, Take 1 capsule (400 mg total) by mouth 4 (four) times daily., Disp: 120 capsule, Rfl: 6 .  linaclotide (LINZESS) 290 MCG CAPS  capsule, Take 1 capsule (290 mcg total) by mouth daily before breakfast., Disp: 90 capsule, Rfl: 3 .  MELATONIN PO, Take 0.5 mg by mouth daily., Disp: , Rfl:  .  meloxicam (MOBIC) 7.5 MG tablet, Take 1 tablet (7.5 mg total) by mouth 2 (two) times daily as needed for pain., Disp: 60 tablet, Rfl: 5 .  naproxen sodium (ALEVE) 220 MG tablet, Take 220 mg by mouth. As needed.  PM, Disp: , Rfl:  .  valACYclovir (VALTREX) 500 MG tablet, 1000 mg daily x 5-7 days with outbreak and 500 mg daily for prevention, Disp: 140 tablet, Rfl: 3  EXAM:  VITALS per patient if applicable:  GENERAL: alert, oriented, appears well and in no acute distress  HEENT: atraumatic, conjunttiva clear, no obvious abnormalities on inspection of external nose and ears  NECK: normal movements of the head and neck  LUNGS: on inspection no signs of respiratory distress, breathing rate appears normal, no obvious gross SOB, gasping or wheezing  CV: no obvious  cyanosis  MS: moves all visible extremities without noticeable abnormality  PSYCH/NEURO: pleasant and cooperative, no obvious depression or anxiety, speech and thought processing grossly intact  ASSESSMENT AND PLAN:  Discussed the following assessment and plan:  Recurrent genital HSV (herpes simplex virus) infection - Plan: valACYclovir (VALTREX) 500 MG tablet (1000 mg qd dose x 5-7 days outbreak and 500 mg qd prevention  Disc L lysine  Anxiety and depression Cont same meds for now disc stress relax tranquil sleep  Hyperlipidemia, unspecified hyperlipidemia type Info given repeat at f/u  Abnormal thyroid function test Repeat thyroid labs at f/u   HM Flu shot utd  Disc shingrix vaccineprevand again today pt wants to get in the future here rec 2 doses  -can call and get appt here  Also disc covid 19 vaccine today  Tdap had 06/01/12  -08/06/17 mammogram neg dense breasts mammo 02/18/19 benign mass left breast repeat dx in 6 months left breast and screening right appt sch 08/13/19   S/p hysterectomy for fibroids 2007 no h/o abnormal pap   Former light smoker not long term   H/o Aks dermatology consider f/u in future Colonoscopy disc at f/uhad 5 years ago 1 polyp ? Adenoma pt to think about disc today 06/16/18 -referred today Dr. Allen Norris pt had sch 06/2019 but cancelled and will reschedule   - former PCP Dr. Lourena Simmonds in [place] Vit D def rec 5000 IU D3 daily  -we discussed possible serious and likely etiologies, options for evaluation and workup, limitations of telemedicine visit vs in person visit, treatment, treatment risks and precautions. Pt prefers to treat via telemedicine empirically rather then risking or undertaking an in person visit at this moment. Patient agrees to seek prompt in person care if worsening, new symptoms arise, or if is not improving with treatment.   I discussed the assessment and treatment plan with the patient. The patient was provided an  opportunity to ask questions and all were answered. The patient agreed with the plan and demonstrated an understanding of the instructions.   The patient was advised to call back or seek an in-person evaluation if the symptoms worsen or if the condition fails to improve as anticipated.  Time spent 20-29 minutes Delorise Jackson, MD

## 2019-07-09 NOTE — Patient Instructions (Signed)
L lysine nature made daily over the counter  Stress relax tranquil sleep from [facility] or whole foods   High Cholesterol  High cholesterol is a condition in which the blood has high levels of a white, waxy, fat-like substance (cholesterol). The human body needs small amounts of cholesterol. The liver makes all the cholesterol that the body needs. Extra (excess) cholesterol comes from the food that we eat. Cholesterol is carried from the liver by the blood through the blood vessels. If you have high cholesterol, deposits (plaques) may build up on the walls of your blood vessels (arteries). Plaques make the arteries narrower and stiffer. Cholesterol plaques increase your risk for heart attack and stroke. Work with your health care provider to keep your cholesterol levels in a healthy range. What increases the risk? This condition is more likely to develop in people who:  Eat foods that are high in animal fat (saturated fat) or cholesterol.  Are overweight.  Are not getting enough exercise.  Have a family history of high cholesterol. What are the signs or symptoms? There are no symptoms of this condition. How is this diagnosed? This condition may be diagnosed from the results of a blood test.  If you are older than age 12, your health care provider may check your cholesterol every 4-6 years.  You may be checked more often if you already have high cholesterol or other risk factors for heart disease. The blood test for cholesterol measures:  "Bad" cholesterol (LDL cholesterol). This is the main type of cholesterol that causes heart disease. The desired level for LDL is less than 100.  "Good" cholesterol (HDL cholesterol). This type helps to protect against heart disease by cleaning the arteries and carrying the LDL away. The desired level for HDL is 60 or higher.  Triglycerides. These are fats that the body can store or burn for energy. The desired number for triglycerides is lower than  150.  Total cholesterol. This is a measure of the total amount of cholesterol in your blood, including LDL cholesterol, HDL cholesterol, and triglycerides. A healthy number is less than 200. How is this treated? This condition is treated with diet changes, lifestyle changes, and medicines. Diet changes  This may include eating more whole grains, fruits, vegetables, nuts, and fish.  This may also include cutting back on red meat and foods that have a lot of added sugar. Lifestyle changes  Changes may include getting at least 40 minutes of aerobic exercise 3 times a week. Aerobic exercises include walking, biking, and swimming. Aerobic exercise along with a healthy diet can help you maintain a healthy weight.  Changes may also include quitting smoking. Medicines  Medicines are usually given if diet and lifestyle changes have failed to reduce your cholesterol to healthy levels.  Your health care provider may prescribe a statin medicine. Statin medicines have been shown to reduce cholesterol, which can reduce the risk of heart disease. Follow these instructions at home: Eating and drinking If told by your health care provider:  Eat chicken (without skin), fish, veal, shellfish, ground [place] breast, and round or loin cuts of red meat.  Do not eat fried foods or fatty meats, such as hot dogs and salami.  Eat plenty of fruits, such as apples.  Eat plenty of vegetables, such as broccoli, potatoes, and carrots.  Eat beans, peas, and lentils.  Eat grains such as barley, rice, couscous, and bulgur wheat.  Eat pasta without cream sauces.  Use skim or nonfat milk, and  eat low-fat or nonfat yogurt and cheeses.  Do not eat or drink whole milk, cream, ice cream, egg yolks, or hard cheeses.  Do not eat stick margarine or tub margarines that contain trans fats (also called partially hydrogenated oils).  Do not eat saturated tropical oils, such as coconut oil and palm oil.  Do not eat  cakes, cookies, crackers, or other baked goods that contain trans fats.  General instructions  Exercise as directed by your health care provider. Increase your activity level with activities such as gardening, walking, and taking the stairs.  Take over-the-counter and prescription medicines only as told by your health care provider.  Do not use any products that contain nicotine or tobacco, such as cigarettes and e-cigarettes. If you need help quitting, ask your health care provider.  Keep all follow-up visits as told by your health care provider. This is important. Contact a health care provider if:  You are struggling to maintain a healthy diet or weight.  You need help to start on an exercise program.  You need help to stop smoking. Get help right away if:  You have chest pain.  You have trouble breathing. This information is not intended to replace advice given to you by your health care provider. Make sure you discuss any questions you have with your health care provider. Document Revised: 06/20/2017 Document Reviewed: 12/16/2015 Elsevier Patient Education  [place].  Cholesterol Content in Foods Cholesterol is a waxy, fat-like substance that helps to carry fat in the blood. The body needs cholesterol in small amounts, but too much cholesterol can cause damage to the arteries and heart. Most people should eat less than 200 milligrams (mg) of cholesterol a day. Foods with cholesterol  Cholesterol is found in animal-based foods, such as meat, seafood, and dairy. Generally, low-fat dairy and lean meats have less cholesterol than full-fat dairy and fatty meats. The milligrams of cholesterol per serving (mg per serving) of common cholesterol-containing foods are listed below. Meat and other proteins  Egg -- one large whole egg has 186 mg.  Veal shank -- 4 oz has 141 mg.  Lean ground [place] (93% lean) -- 4 oz has 118 mg.  Fat-trimmed lamb loin -- 4 oz has 106  mg.  Lean ground beef (90% lean) -- 4 oz has 100 mg.  Lobster -- 3.5 oz has 90 mg.  Pork loin chops -- 4 oz has 86 mg.  Canned salmon -- 3.5 oz has 83 mg.  Fat-trimmed beef top loin -- 4 oz has 78 mg.  Frankfurter -- 1 frank (3.5 oz) has 77 mg.  Crab -- 3.5 oz has 71 mg.  Roasted chicken without skin, white meat -- 4 oz has 66 mg.  Light bologna -- 2 oz has 45 mg.  Deli-cut [place] -- 2 oz has 31 mg.  Canned tuna -- 3.5 oz has 31 mg.  Berniece Salines -- 1 oz has 29 mg.  Oysters and mussels (raw) -- 3.5 oz has 25 mg.  Mackerel -- 1 oz has 22 mg.  Trout -- 1 oz has 20 mg.  Pork sausage -- 1 link (1 oz) has 17 mg.  Salmon -- 1 oz has 16 mg.  Tilapia -- 1 oz has 14 mg. Dairy  Soft-serve ice cream --  cup (4 oz) has 103 mg.  Whole-milk yogurt -- 1 cup (8 oz) has 29 mg.  Cheddar cheese -- 1 oz has 28 mg.  American cheese -- 1 oz has 28 mg.  Whole milk -- 1 cup (8 oz) has 23 mg.  2% milk -- 1 cup (8 oz) has 18 mg.  Cream cheese -- 1 tablespoon (Tbsp) has 15 mg.  Cottage cheese --  cup (4 oz) has 14 mg.  Low-fat (1%) milk -- 1 cup (8 oz) has 10 mg.  Sour cream -- 1 Tbsp has 8.5 mg.  Low-fat yogurt -- 1 cup (8 oz) has 8 mg.  Nonfat Greek yogurt -- 1 cup (8 oz) has 7 mg.  Half-and-half cream -- 1 Tbsp has 5 mg. Fats and oils  Cod liver oil -- 1 tablespoon (Tbsp) has 82 mg.  Butter -- 1 Tbsp has 15 mg.  Lard -- 1 Tbsp has 14 mg.  Bacon grease -- 1 Tbsp has 14 mg.  Mayonnaise -- 1 Tbsp has 5-10 mg.  Margarine -- 1 Tbsp has 3-10 mg. Exact amounts of cholesterol in these foods may vary depending on specific ingredients and brands. Foods without cholesterol Most plant-based foods do not have cholesterol unless you combine them with a food that has cholesterol. Foods without cholesterol include:  Grains and cereals.  Vegetables.  Fruits.  Vegetable oils, such as olive, canola, and sunflower oil.  Legumes, such as peas, beans, and lentils.  Nuts and  seeds.  Egg whites. Summary  The body needs cholesterol in small amounts, but too much cholesterol can cause damage to the arteries and heart.  Most people should eat less than 200 milligrams (mg) of cholesterol a day. This information is not intended to replace advice given to you by your health care provider. Make sure you discuss any questions you have with your health care provider. Document Revised: 05/30/2017 Document Reviewed: 02/11/2017 Elsevier Patient Education  [place].

## 2019-07-13 ENCOUNTER — Telehealth: Payer: Self-pay | Admitting: Internal Medicine

## 2019-07-13 DIAGNOSIS — F329 Major depressive disorder, single episode, unspecified: Secondary | ICD-10-CM

## 2019-07-13 DIAGNOSIS — F419 Anxiety disorder, unspecified: Secondary | ICD-10-CM

## 2019-07-13 MED ORDER — FLUOXETINE HCL 40 MG PO CAPS: 40.0000 mg | ORAL_CAPSULE | Freq: Every day | ORAL | 2 refills | Status: DC

## 2019-07-13 NOTE — Telephone Encounter (Signed)
Pt needs a refill on FLUoxetine (PROZAC) 40 MG capsule Sent to [facility]  She is completely out

## 2019-07-27 ENCOUNTER — Telehealth: Payer: Self-pay | Admitting: Internal Medicine

## 2019-07-27 NOTE — Telephone Encounter (Signed)
Patient wanted Dr. Olivia Mackie to know prispiq, was the medication she was talking to her about.

## 2019-07-27 NOTE — Telephone Encounter (Signed)
I am not as familiar with pristiq it is an antidepressant  Does she want to try to switch to this?

## 2019-07-27 NOTE — Telephone Encounter (Signed)
Patient wanted to let Dr. Olivia Mackie know that

## 2019-07-29 NOTE — Telephone Encounter (Signed)
Prozac wasn't helping. And insurance quit paying for it. She hasnt been on prozac for awhile. She was on this medication back in 2006. She knew that worked for her. She is wanting something different. She is open to try others. Please advise.

## 2019-07-30 NOTE — Telephone Encounter (Signed)
We can see if her insurance will cover pristiq again  -does she want to do this?    Otherwise adding cymbalta or effexor  This can be trial and error Does she need to speak with therapy? Advise and give # for [facility]    [facility]

## 2019-08-02 ENCOUNTER — Other Ambulatory Visit: Payer: Self-pay | Admitting: Internal Medicine

## 2019-08-02 ENCOUNTER — Encounter: Payer: Self-pay | Admitting: Internal Medicine

## 2019-08-02 NOTE — Telephone Encounter (Signed)
Noted   tMS

## 2019-08-02 NOTE — Telephone Encounter (Signed)
Patient say she does not want to try therapy or any meds right now she is trying to get off meds and will call back if she feels she needs anything . Patient stated for record Effexor and Cymbalta make her feel ike her head is going to explode. The Pristiq she wants to hold off due to she is trying to get off so many meds , and turned downt herapy.

## 2019-08-05 ENCOUNTER — Other Ambulatory Visit: Payer: Self-pay | Admitting: Internal Medicine

## 2019-08-05 DIAGNOSIS — M5412 Radiculopathy, cervical region: Secondary | ICD-10-CM

## 2019-08-05 MED ORDER — GABAPENTIN 400 MG PO CAPS: 400.0000 mg | ORAL_CAPSULE | Freq: Four times a day (QID) | ORAL | 11 refills | Status: AC

## 2019-08-13 ENCOUNTER — Ambulatory Visit
Admission: RE | Admit: 2019-08-13 | Discharge: 2019-08-13 | Disposition: A | Discharge status: Home / Self Care | Payer: Managed Care, Other (non HMO) | Source: Ambulatory Visit | Attending: Internal Medicine | Admitting: Internal Medicine

## 2019-08-13 DIAGNOSIS — Z1231 Encounter for screening mammogram for malignant neoplasm of breast: Secondary | ICD-10-CM | POA: Diagnosis present

## 2019-08-13 DIAGNOSIS — N6320 Unspecified lump in the left breast, unspecified quadrant: Secondary | ICD-10-CM | POA: Diagnosis present

## 2019-08-22 ENCOUNTER — Other Ambulatory Visit: Payer: Self-pay | Admitting: Internal Medicine

## 2019-08-22 DIAGNOSIS — F419 Anxiety disorder, unspecified: Secondary | ICD-10-CM

## 2019-08-22 DIAGNOSIS — F329 Major depressive disorder, single episode, unspecified: Secondary | ICD-10-CM

## 2019-08-22 DIAGNOSIS — Z780 Asymptomatic menopausal state: Secondary | ICD-10-CM

## 2019-08-22 MED ORDER — BUPROPION HCL ER (XL) 300 MG PO TB24: 300.0000 mg | ORAL_TABLET | Freq: Every day | ORAL | 3 refills | Status: AC

## 2019-08-22 MED ORDER — ESTRADIOL 0.5 MG PO TABS: 0.5000 mg | ORAL_TABLET | Freq: Every day | ORAL | 3 refills | Status: AC

## 2019-08-25 ENCOUNTER — Encounter: Payer: Self-pay | Admitting: Internal Medicine

## 2019-08-25 ENCOUNTER — Other Ambulatory Visit: Payer: Self-pay

## 2019-08-25 ENCOUNTER — Ambulatory Visit: Payer: Managed Care, Other (non HMO) | Admitting: Internal Medicine

## 2019-08-25 VITALS — BP 120/84 | HR 56 | Temp 97.0°F | Ht 62.0 in | Wt 107.6 lb

## 2019-08-25 DIAGNOSIS — B373 Candidiasis of vulva and vagina: Secondary | ICD-10-CM | POA: Diagnosis not present

## 2019-08-25 DIAGNOSIS — H6123 Impacted cerumen, bilateral: Secondary | ICD-10-CM

## 2019-08-25 DIAGNOSIS — F329 Major depressive disorder, single episode, unspecified: Secondary | ICD-10-CM

## 2019-08-25 DIAGNOSIS — B3731 Acute candidiasis of vulva and vagina: Secondary | ICD-10-CM

## 2019-08-25 DIAGNOSIS — E785 Hyperlipidemia, unspecified: Secondary | ICD-10-CM | POA: Diagnosis not present

## 2019-08-25 DIAGNOSIS — R946 Abnormal results of thyroid function studies: Secondary | ICD-10-CM

## 2019-08-25 DIAGNOSIS — F419 Anxiety disorder, unspecified: Secondary | ICD-10-CM

## 2019-08-25 DIAGNOSIS — C44612 Basal cell carcinoma of skin of right upper limb, including shoulder: Secondary | ICD-10-CM

## 2019-08-25 DIAGNOSIS — L03031 Cellulitis of right toe: Secondary | ICD-10-CM | POA: Diagnosis not present

## 2019-08-25 DIAGNOSIS — L603 Nail dystrophy: Secondary | ICD-10-CM

## 2019-08-25 LAB — LIPID PANEL
Cholesterol: 207 mg/dL — ABNORMAL HIGH (ref 0–200)
HDL: 78.7 mg/dL (ref >39.00)
LDL Cholesterol: 111 mg/dL — ABNORMAL HIGH (ref 0–99)
NonHDL: 128.55
Total CHOL/HDL Ratio: 3
Triglycerides: 90 mg/dL (ref 0.0–149.0)
VLDL: 18 mg/dL (ref 0.0–40.0)

## 2019-08-25 LAB — TSH: TSH: 3.72 u[IU]/mL (ref 0.35–4.50)

## 2019-08-25 LAB — T3, FREE: T3, Free: 2.6 pg/mL (ref 2.3–4.2)

## 2019-08-25 LAB — T4, FREE: Free T4: 0.6 ng/dL (ref 0.60–1.60)

## 2019-08-25 MED ORDER — MUPIROCIN 2 % EX OINT: 1.0000 "application " | TOPICAL_OINTMENT | Freq: Two times a day (BID) | CUTANEOUS | 0 refills | Status: AC

## 2019-08-25 MED ORDER — FLUCONAZOLE 150 MG PO TABS: 150.0000 mg | ORAL_TABLET | Freq: Once | ORAL | 2 refills | Status: AC

## 2019-08-25 NOTE — Patient Instructions (Addendum)
[facility]  [place]  Bloom app, Replika, insight timer/headspace, calm   Warm soap soaks  lamisil or clotrimazole antifungal cream 1-2 x per day  With bactroban   biosil drops whole foods  Hair skin nail vitamin with biotin caution biotin can cause thyroid #s to be abnormal   Generalized Anxiety Disorder, Adult Generalized anxiety disorder (GAD) is a mental health disorder. People with this condition constantly worry about everyday events. Unlike normal anxiety, worry related to GAD is not triggered by a specific event. These worries also do not fade or get better with time. GAD interferes with life functions, including relationships, work, and school. GAD can vary from mild to severe. People with severe GAD can have intense waves of anxiety with physical symptoms (panic attacks). What are the causes? The exact cause of GAD is not known. What increases the risk? This condition is more likely to develop in:  Women.  People who have a family history of anxiety disorders.  People who are very shy.  People who experience very stressful life events, such as the death of a loved one.  People who have a very stressful family environment. What are the signs or symptoms? People with GAD often worry excessively about many things in their lives, such as their health and family. They may also be overly concerned about:  Doing well at work.  Being on time.  Natural disasters.  Friendships. Physical symptoms of GAD include:  Fatigue.  Muscle tension or having muscle twitches.  Trembling or feeling shaky.  Being easily startled.  Feeling like your heart is pounding or racing.  Feeling out of breath or like you cannot take a deep breath.  Having trouble falling asleep or staying asleep.  Sweating.  Nausea, diarrhea, or irritable bowel syndrome (IBS).  Headaches.  Trouble concentrating or remembering  facts.  Restlessness.  Irritability. How is this diagnosed? Your health care provider can diagnose GAD based on your symptoms and medical history. You will also have a physical exam. The health care provider will ask specific questions about your symptoms, including how severe they are, when they started, and if they come and go. Your health care provider may ask you about your use of alcohol or drugs, including prescription medicines. Your health care provider may refer you to a mental health specialist for further evaluation. Your health care provider will do a thorough examination and may perform additional tests to rule out other possible causes of your symptoms. To be diagnosed with GAD, a person must have anxiety that:  Is out of his or her control.  Affects several different aspects of his or her life, such as work and relationships.  Causes distress that makes him or her unable to take part in normal activities.  Includes at least three physical symptoms of GAD, such as restlessness, fatigue, trouble concentrating, irritability, muscle tension, or sleep problems. Before your health care provider can confirm a diagnosis of GAD, these symptoms must be present more days than they are not, and they must last for six months or longer. How is this treated? The following therapies are usually used to treat GAD:  Medicine. Antidepressant medicine is usually prescribed for long-term daily control. Antianxiety medicines may be added in severe cases, especially when panic attacks occur.  Talk therapy (psychotherapy). Certain types of talk therapy can be helpful in treating GAD by providing support, education, and guidance. Options include: ? Cognitive behavioral therapy (CBT). People  learn coping skills and techniques to ease their anxiety. They learn to identify unrealistic or negative thoughts and behaviors and to replace them with positive ones. ? Acceptance and commitment therapy (ACT).  This treatment teaches people how to be mindful as a way to cope with unwanted thoughts and feelings. ? Biofeedback. This process trains you to manage your body's response (physiological response) through breathing techniques and relaxation methods. You will work with a therapist while machines are used to monitor your physical symptoms.  Stress management techniques. These include yoga, meditation, and exercise. A mental health specialist can help determine which treatment is best for you. Some people see improvement with one type of therapy. However, other people require a combination of therapies. Follow these instructions at home:  Take over-the-counter and prescription medicines only as told by your health care provider.  Try to maintain a normal routine.  Try to anticipate stressful situations and allow extra time to manage them.  Practice any stress management or self-calming techniques as taught by your health care provider.  Do not punish yourself for setbacks or for not making progress.  Try to recognize your accomplishments, even if they are small.  Keep all follow-up visits as told by your health care provider. This is important. Contact a health care provider if:  Your symptoms do not get better.  Your symptoms get worse.  You have signs of depression, such as: ? A persistently sad, cranky, or irritable mood. ? Loss of enjoyment in activities that used to bring you joy. ? Change in weight or eating. ? Changes in sleeping habits. ? Avoiding friends or family members. ? Loss of energy for normal tasks. ? Feelings of guilt or worthlessness. Get help right away if:  You have serious thoughts about hurting yourself or others. If you ever feel like you may hurt yourself or others, or have thoughts about taking your own life, get help right away. You can go to your nearest emergency department or call:  Your local emergency services (911 in the [place]  A suicide crisis  helpline, such as the [facility] at 989-420-5480. This is open 24 hours a day. Summary  Generalized anxiety disorder (GAD) is a mental health disorder that involves worry that is not triggered by a specific event.  People with GAD often worry excessively about many things in their lives, such as their health and family.  GAD may cause physical symptoms such as restlessness, trouble concentrating, sleep problems, frequent sweating, nausea, diarrhea, headaches, and trembling or muscle twitching.  A mental health specialist can help determine which treatment is best for you. Some people see improvement with one type of therapy. However, other people require a combination of therapies. This information is not intended to replace advice given to you by your health care provider. Make sure you discuss any questions you have with your health care provider. Document Revised: 05/30/2017 Document Reviewed: 05/07/2016 Elsevier Patient Education  2020 [employer].

## 2019-08-25 NOTE — Progress Notes (Signed)
Chief Complaint  Patient presents with  . Follow-up  . Depression  . Anxiety   F/u  1. Anxiety and depression GAD 7 10 and PH9 score 8 prozac 40 did not work and Biomedical scientist had side effects on wellbutrin 300 mg xl wants referral to therapy for now not psych given list of [facility] and Dr. Virgilio Belling to call in the future  2. Right forearm BCC removed fall 2020 f/u Dr. Evorn Gong upcoming after this visit in 2021  3. She thinks she has yeast infection wants pill to treat white discharge h/o recurrent using honey pot soap otc [facility]  4. Wants ears checked tried to clean ears not used debrox in a while and hears something in her ears  5. Abnormal thyroid labs will get labs today she is not fasting had coffee with sugar and milk low fast checked complete thyroid labs and lipid today  6. Trauma to right great toe 12/2018 and toenail not growing back as it should will refer to podiatry she prev had trauma to left great toe  7. HSV improved  8. She needs yearly FMLA paperwork filled out due to chronic neck pain and spinal stenosis   Review of Systems  Constitutional: Negative for weight loss.  HENT: Negative for hearing loss.   Eyes: Negative for blurred vision.  Musculoskeletal: Negative for falls.  Skin: Negative for rash.       +toenail changes   Psychiatric/Behavioral: Positive for depression. The patient is nervous/anxious.    Past Medical History:  Diagnosis Date  . Actinic keratoses   . Anxiety   . Arthritis   . BCC (basal cell carcinoma of skin)    right forearm Dr. Evorn Gong in 2020 fall   . Cat bite    05/07/18  . Chicken pox   . Colon polyps    2014   . Depression   . Diverticulitis   . Herpes genitalis    since 52s  . IBS (irritable bowel syndrome)    constipation  . Vitamin D deficiency    Past Surgical History:  Procedure Laterality Date  . ABDOMINAL HYSTERECTOMY     2007 fibroids   . CATARACT EXTRACTION    . CHOLECYSTECTOMY     2016   Family History   Problem Relation Age of Onset  . Arthritis Mother   . Depression Mother   . Hearing loss Mother   . Hypertension Mother   . Asthma Father   . Cancer Father        kidney cancer not a smoker   . Kidney disease Father   . Arthritis Sister   . Hypertension Sister   . Cancer Brother        nmsc skin ca  . Breast cancer Neg Hx    Social History   Socioeconomic History  . Marital status: Married    Spouse name: Not on file  . Number of children: Not on file  . Years of education: Not on file  . Highest education level: Not on file  Occupational History  . Not on file  Tobacco Use  . Smoking status: Never Smoker  . Smokeless tobacco: Never Used  . Tobacco comment: light smoker short duration   Substance and Sexual Activity  . Alcohol use: Yes    Comment: occassionally  . Drug use: Not Currently  . Sexual activity: Not Currently    Comment: men  Other Topics Concern  . Not on file  Social History Narrative  Divorced    1. 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 Difficulty of Paying Living Expenses: Not on file  Food Insecurity:   . Worried About Charity fundraiser in the Last Year: Not on file  . Ran Out of Food in the Last Year: Not on file  Transportation Needs:   . Lack of Transportation (Medical): Not on file  . Lack of Transportation (Non-Medical): Not on file  Physical Activity:   . Days of Exercise per Week: Not on file  . Minutes of Exercise per Session: Not on file  Stress:   . Feeling of Stress : Not on file  Social Connections:   . Frequency of Communication with Friends and Family: Not on file  . Frequency of Social Gatherings with Friends and Family: Not on file  . Attends Religious Services: Not on file  . Active Member of Clubs or Organizations: Not on file  . Attends Archivist Meetings: Not on  file  . Marital Status: Not on file  Intimate Partner Violence:   . Fear of Current or Ex-Partner: Not on file  . Emotionally Abused: Not on file  . Physically Abused: Not on file  . Sexually Abused: Not on file   Current Meds  Medication Sig  . buPROPion (WELLBUTRIN XL) 300 MG 24 hr tablet Take 1 tablet (300 mg total) by mouth daily.  . Cholecalciferol (VITAMIN D3) 50 MCG (2000 UT) TABS Take by mouth.  . estradiol (ESTRACE) 0.5 MG tablet Take 1 tablet (0.5 mg total) by mouth daily.  Marland Kitchen gabapentin (NEURONTIN) 400 MG capsule Take 1 capsule (400 mg total) by mouth 4 (four) times daily.  Marland Kitchen linaclotide (LINZESS) 290 MCG CAPS capsule Take 1 capsule (290 mcg total) by mouth daily before breakfast.  . MELATONIN PO Take 0.5 mg by mouth daily.  . naproxen sodium (ALEVE) 220 MG tablet Take 220 mg by mouth. As needed.  PM  . valACYclovir (VALTREX) 500 MG tablet 1000 mg daily x 5-7 days with outbreak and 500 mg daily for prevention   Allergies  Allergen Reactions  . Oxycodone-Acetaminophen     Other reaction(s): Other sick  . Cymbalta [Duloxetine Hcl]     Head going to explode per pt    . Effexor [Venlafaxine]     Head going to explode per pt    . Penicillins     Numbness in fingers child     No results found for this or any previous visit (from the past 2160 hour(s)). Objective  Body mass index is 19.68 kg/m. Wt Readings from Last 3 Encounters:  08/25/19 107 lb 9.6 oz (48.8 kg)  07/09/19 106 lb (48.1 kg)  02/19/19 117 lb 3.2 oz (53.2 kg)   Temp Readings from Last 3 Encounters:  08/25/19 (!) 97 F (36.1 C) (Temporal)  06/16/18 99 F (37.2 C) (Oral)  05/08/18 98.1 F (36.7 C) (Oral)   BP Readings from Last 3 Encounters:  08/25/19 120/84  06/16/18 (!) 126/92  05/08/18 132/90   Pulse Readings from Last 3 Encounters:  08/25/19 (!) 56  06/16/18 75  05/08/18 71    Physical Exam Vitals and nursing note reviewed.  Constitutional:      Appearance: Normal appearance. She is  well-developed and  well-groomed.  HENT:     Head: Normocephalic and atraumatic.     Ears:     Comments: B/l cerumen in ears  Eyes:     Conjunctiva/sclera: Conjunctivae normal.     Pupils: Pupils are equal, round, and reactive to light.  Cardiovascular:     Rate and Rhythm: Normal rate and regular rhythm.     Heart sounds: Normal heart sounds. No murmur.  Pulmonary:     Effort: Pulmonary effort is normal.     Breath sounds: Normal breath sounds.  Skin:    General: Skin is warm and dry.       Neurological:     General: No focal deficit present.     Mental Status: She is alert and oriented to person, place, and time. Mental status is at baseline.     Gait: Gait is intact. Gait normal.  Psychiatric:        Attention and Perception: Attention and perception normal.        Mood and Affect: Mood and affect normal.        Speech: Speech normal.        Behavior: Behavior normal. Behavior is cooperative.        Thought Content: Thought content normal.        Cognition and Memory: Cognition and memory normal.        Judgment: Judgment normal.     Assessment  Plan  Anxiety and depression Let me know about therapy disc Dr. Jonelle Sidle [facility]  Let me know if needs referral  Cont meds  GAD 7 10 and PHQ 9 score 8   Yeast vaginitis - Plan: fluconazole (DIFLUCAN) 150 MG tablet  Paronychia of toenail of right foot - Plan: mupirocin ointment (BACTROBAN) 2 % Nail dystrophy - Plan: Ambulatory referral to Podiatry   Abnormal thyroid function test - Plan: Thyroid peroxidase antibody, T3, free, TSH, T4, free  Hyperlipidemia, unspecified hyperlipidemia type - Plan: Lipid panel Not fasting today had coffee with milk and sugar  Basal cell carcinoma (BCC) of skin of right upper extremity including shoulder  Cerumen debris on tympanic membrane of both ears rec debrox ear wax drops  HM Flu shot utd  Disc shingrix vaccineprevand again today pt wants to get in the future here  rec 2 doses -can call and get appt here  covid 19 vx had 1/2 pfizer 08/18/19  Tdap had 06/01/12  -08/06/17 mammogram neg dense breasts mammo8/20/20 benign mass left breast repeat dx in 6 months left breast and screening rightappt sch 08/13/19 ok f/u in 1 year b/l screening   S/p hysterectomy for fibroids 2007 no h/o abnormal pap   Former light smoker not long term   H/o Aks dermatology consider f/u in future in 2021 upcoming with Dr. Evorn Gong after 08/25/19 appt   Colonoscopy disc at f/[facility] 5 years ago 1 polyp ? Adenoma pt to think about disc today 06/16/18 -referred today Dr. Cherlyn Labella had sch 06/2019 but cancelled and will reschedule   - former PCP Dr. Lourena Simmonds in [place] def rec 5000 IU D3 daily   Provider: Dr. Olivia Mackie [facility]

## 2019-08-26 ENCOUNTER — Telehealth: Payer: Self-pay | Admitting: Internal Medicine

## 2019-08-26 LAB — THYROID PEROXIDASE ANTIBODY: Thyroperoxidase Ab SerPl-aCnc: 1 IU/mL (ref <9)

## 2019-08-26 NOTE — Telephone Encounter (Signed)
I did not get her FMLA paperwork yet for the 2021/2022 year  If she can call the [facility] and get them to fax to me asap I will be on vacation until 09/06/19 after 08/27/19    tMS

## 2019-08-27 NOTE — Telephone Encounter (Signed)
Informed patient and she will have them fax that ASAP.

## 2019-08-27 NOTE — Telephone Encounter (Signed)
Pt said that Dr. Olivia Mackie can document for a whole year and not 6 months.

## 2019-09-06 ENCOUNTER — Other Ambulatory Visit: Payer: Self-pay | Admitting: Internal Medicine

## 2019-09-06 DIAGNOSIS — K581 Irritable bowel syndrome with constipation: Secondary | ICD-10-CM

## 2019-09-06 MED ORDER — LINACLOTIDE 290 MCG PO CAPS: 290.0000 ug | ORAL_CAPSULE | Freq: Every day | ORAL | 3 refills | Status: AC

## 2019-09-10 ENCOUNTER — Ambulatory Visit: Payer: Managed Care, Other (non HMO) | Admitting: Podiatry

## 2019-09-13 NOTE — Telephone Encounter (Signed)
Copy sent to scan of 2021 FMLA paperwork.   Patient informed and will come to pick up copy. Placed upfront.

## 2019-10-07 ENCOUNTER — Telehealth: Payer: Self-pay | Admitting: Internal Medicine

## 2019-10-07 NOTE — Telephone Encounter (Signed)
When patient was last seen by Dr. Olivia Mackie the talked about patient having anxiety, she is waking up in the middle of night hot then cold. Patient would like something for it.

## 2019-10-08 NOTE — Telephone Encounter (Signed)
Please advise 

## 2019-10-11 NOTE — Telephone Encounter (Signed)
Left message to return call 

## 2019-10-11 NOTE — Telephone Encounter (Signed)
Patient informed and verbalized understanding.  Pt states she is in between insurances so declines referral and unable to currently see Therapist. Pt states she has bought a mental health workbook that she is using and states that it has helped.   Pt states she is increasing her exercise.   Pt has tried Clexa and does not like the way her head feels while on it. She has also tried hydroxyzine and states it does not help plus makes her sleepy.   Patient would like to try short term low dose Xanax

## 2019-10-11 NOTE — Telephone Encounter (Signed)
Patient returned office phone call. 

## 2019-10-11 NOTE — Telephone Encounter (Signed)
Did she contact therapist?   Does she want to see psychiatry?  Rec exercise    Does she want to try celexa instead of prozac?  Does she want to try hydroxyzine which is non addictive allergy pill we use for anxiety ?  Or We can try low dose xanax which is addictive option for anxiety?   [place]

## 2019-10-14 ENCOUNTER — Other Ambulatory Visit: Payer: Self-pay | Admitting: Internal Medicine

## 2019-10-14 DIAGNOSIS — F419 Anxiety disorder, unspecified: Secondary | ICD-10-CM

## 2019-10-14 MED ORDER — ALPRAZOLAM 0.25 MG PO TABS: 0.2500 mg | ORAL_TABLET | Freq: Every day | ORAL | 2 refills | Status: DC | PRN

## 2019-10-14 NOTE — Telephone Encounter (Signed)
Can see that all other meds were sent in.   Please advise on the Xanax

## 2019-10-14 NOTE — Telephone Encounter (Signed)
Patient is still waiting for her refills and a low dose of Xanax.. Also needs the buPROPion (WELLBUTRIN XL) 300 MG 24 hr tablet refilled, please make that a 3 months supply.

## 2019-12-27 ENCOUNTER — Telehealth: Payer: Self-pay | Admitting: Internal Medicine

## 2019-12-27 NOTE — Telephone Encounter (Signed)
Faxed progress notes and evaluation, list of medication to [facility] [place] [address] [place] [address]

## 2019-12-27 NOTE — Telephone Encounter (Signed)
Received request for records with attached signed release of information.   Sent copy to scan.  Printed last 5 notes, imaging, and labs to be faxed.

## 2020-02-04 ENCOUNTER — Other Ambulatory Visit: Payer: Self-pay | Admitting: Internal Medicine

## 2020-02-04 ENCOUNTER — Telehealth: Payer: Self-pay | Admitting: Internal Medicine

## 2020-02-04 DIAGNOSIS — F419 Anxiety disorder, unspecified: Secondary | ICD-10-CM

## 2020-02-04 NOTE — Telephone Encounter (Signed)
I need the address of the pharmacy entirely there are a [address] of [address]

## 2020-02-04 NOTE — Telephone Encounter (Signed)
Pt would like a 60 day supply of ALPRAZolam (XANAX) 0.25 MG tablet sent to [facility] on [address] in [place] until she gets an appt with new dr in [place]

## 2020-02-08 NOTE — Telephone Encounter (Signed)
Pharmacy updated in Patient chart to [facility] [id number] [place] [address]

## 2020-02-09 ENCOUNTER — Other Ambulatory Visit: Payer: Self-pay | Admitting: Internal Medicine

## 2020-02-09 DIAGNOSIS — F419 Anxiety disorder, unspecified: Secondary | ICD-10-CM

## 2020-02-09 MED ORDER — ALPRAZOLAM 0.25 MG PO TABS: 0.2500 mg | ORAL_TABLET | Freq: Every day | ORAL | 2 refills | Status: AC | PRN

## 2020-02-09 NOTE — Telephone Encounter (Signed)
Pt call back about medication ALPRAZolam (XANAX) 0.25 MG tablet where to seen it the pharmacy in [place] she stated that she checked the pharmacy  and no medication pt stated that she is out of medication

## 2020-02-16 ENCOUNTER — Ambulatory Visit: Payer: Managed Care, Other (non HMO) | Admitting: Internal Medicine

## 2020-10-12 ENCOUNTER — Other Ambulatory Visit: Payer: Self-pay | Admitting: Internal Medicine

## 2020-10-12 DIAGNOSIS — Z780 Asymptomatic menopausal state: Secondary | ICD-10-CM

## 2022-01-20 IMAGING — MG DIGITAL DIAGNOSTIC BILAT W/ TOMO W/ CAD
8 series · 8 of 24 positions shown · non-contrast
Comparison: Previous exam(s).

CLINICAL DATA: Short-term interval follow-up of a probable benign
intramammary lymph node in the left breast originally seen on
screening mammogram dated 08/11/2018.

EXAM:
DIGITAL DIAGNOSTIC BILATERAL MAMMOGRAM WITH CAD AND TOMO

[R MLO synth-2D]
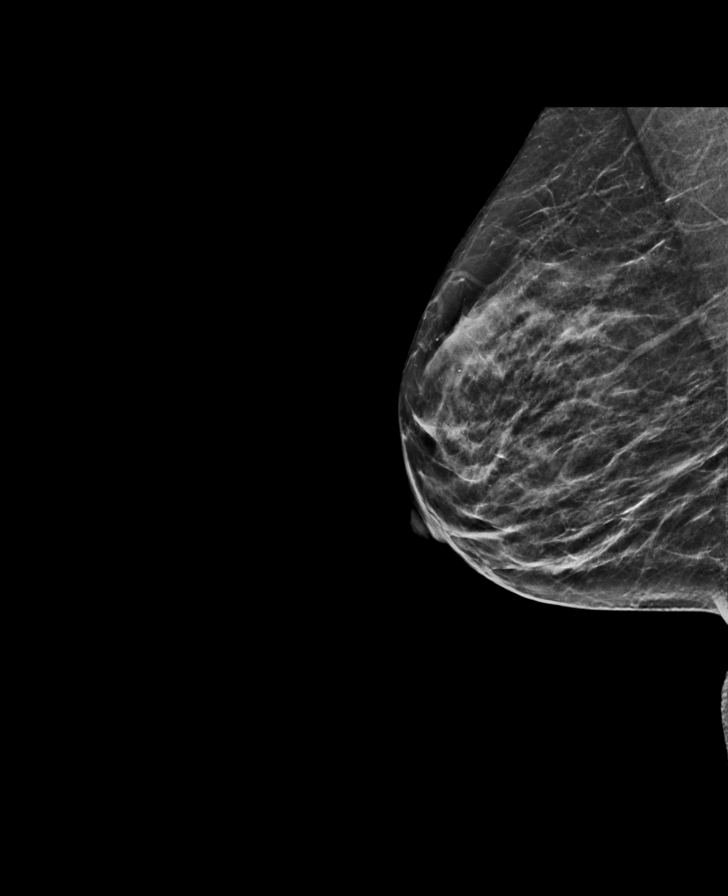

[L CC synth-2D]
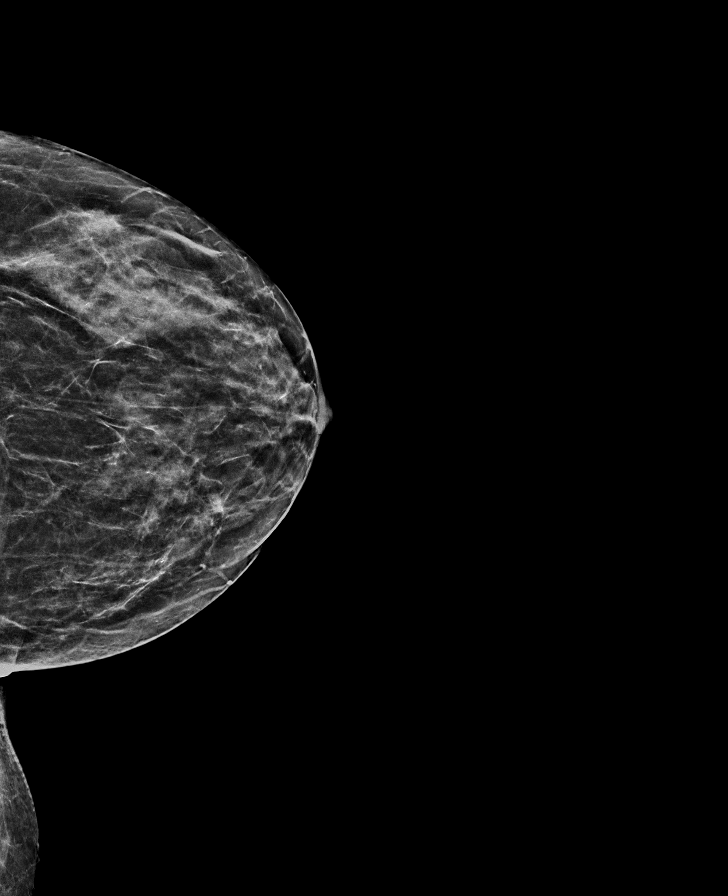

[L MLO synth-2D]
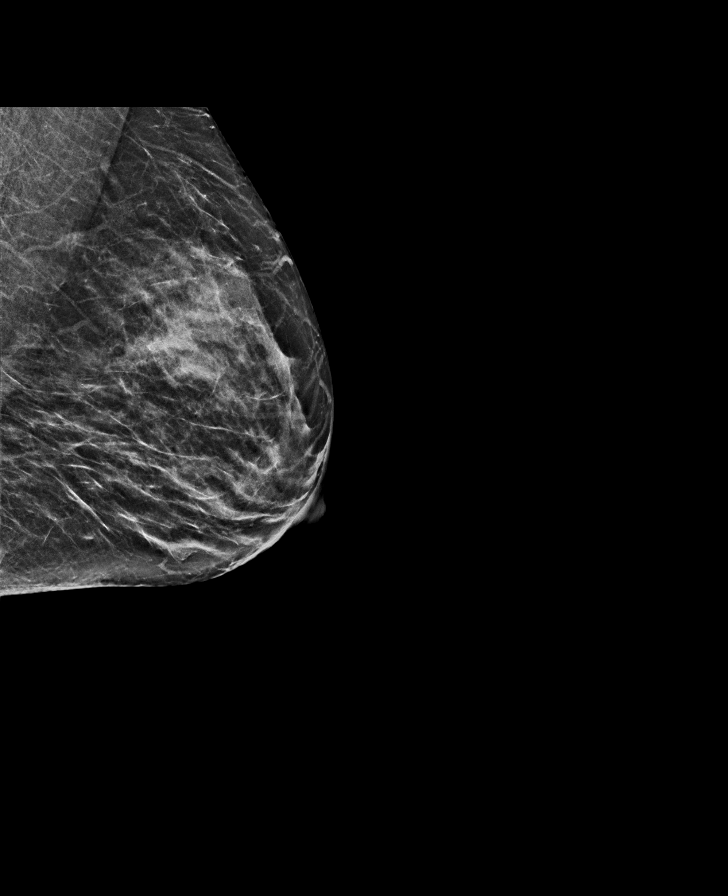

[R CC synth-2D]
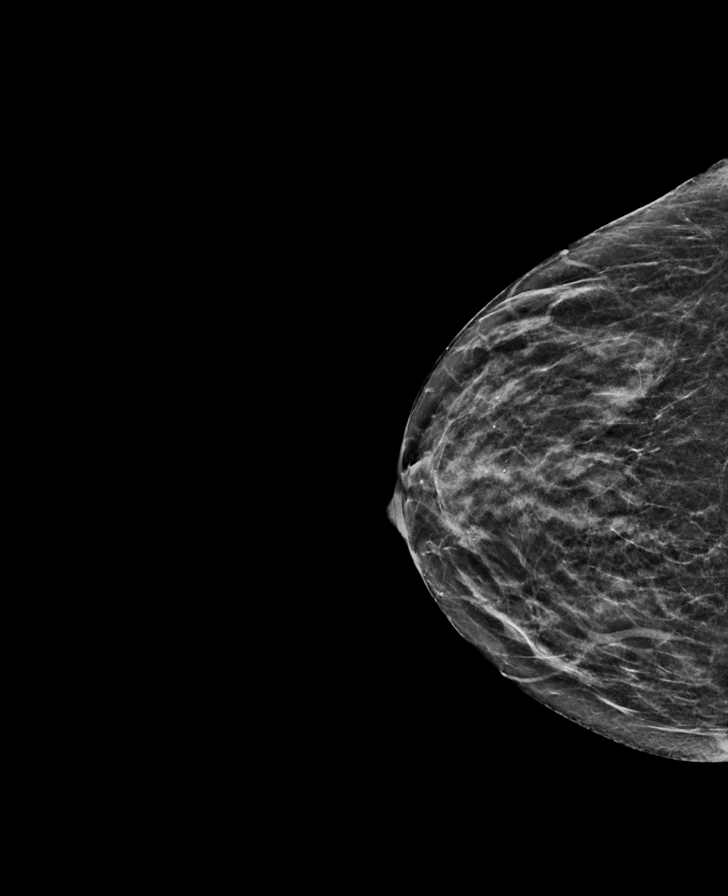

[L MLO tomo · tomo slice 25/50.0]
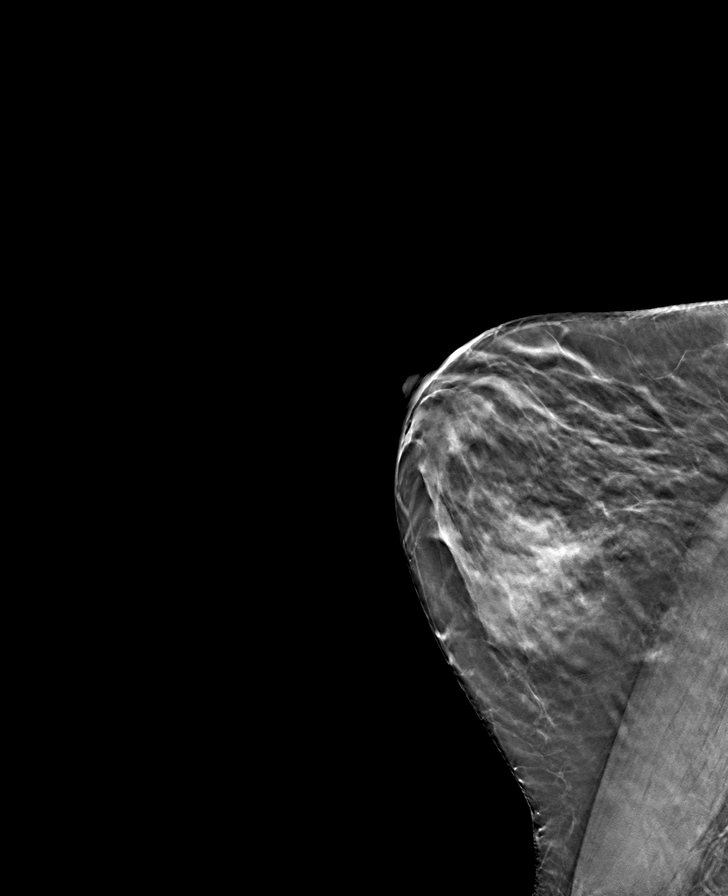

[R CC tomo · tomo slice 23/45.0]
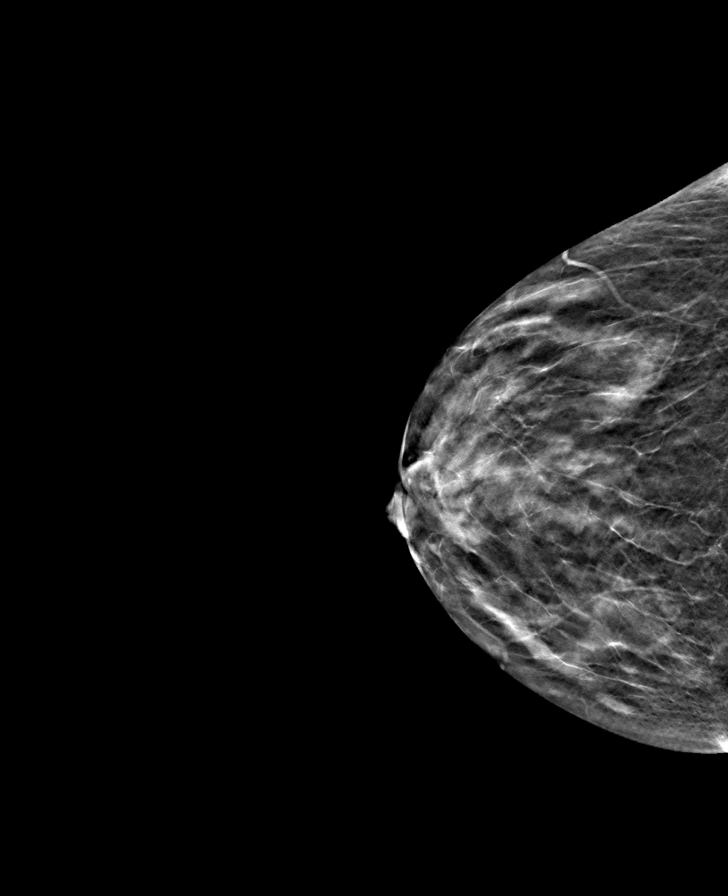

[L CC tomo · tomo slice 25/49.0]
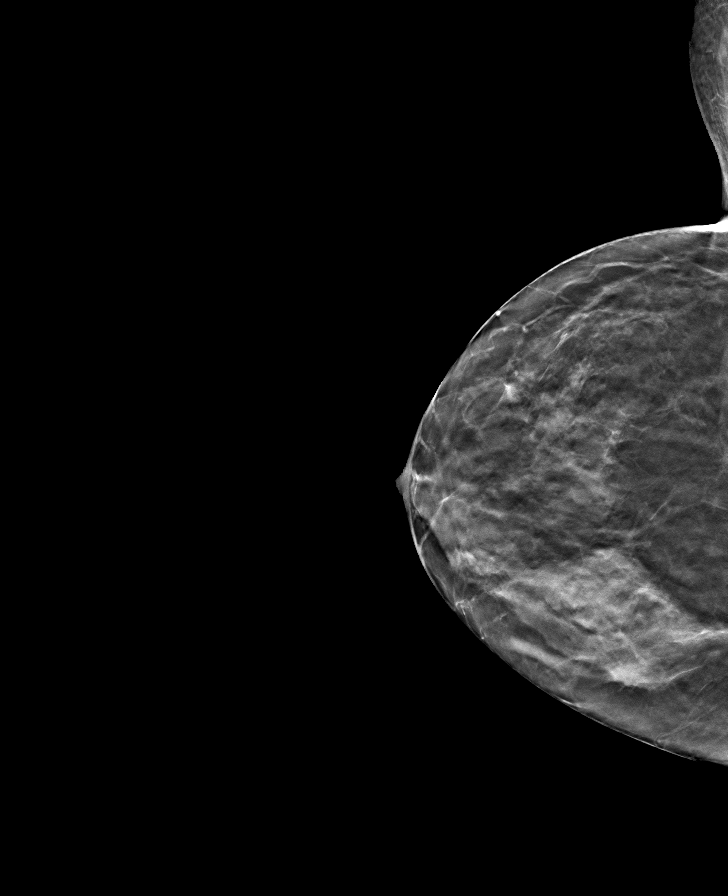

[R MLO tomo · tomo slice 24/47.0]
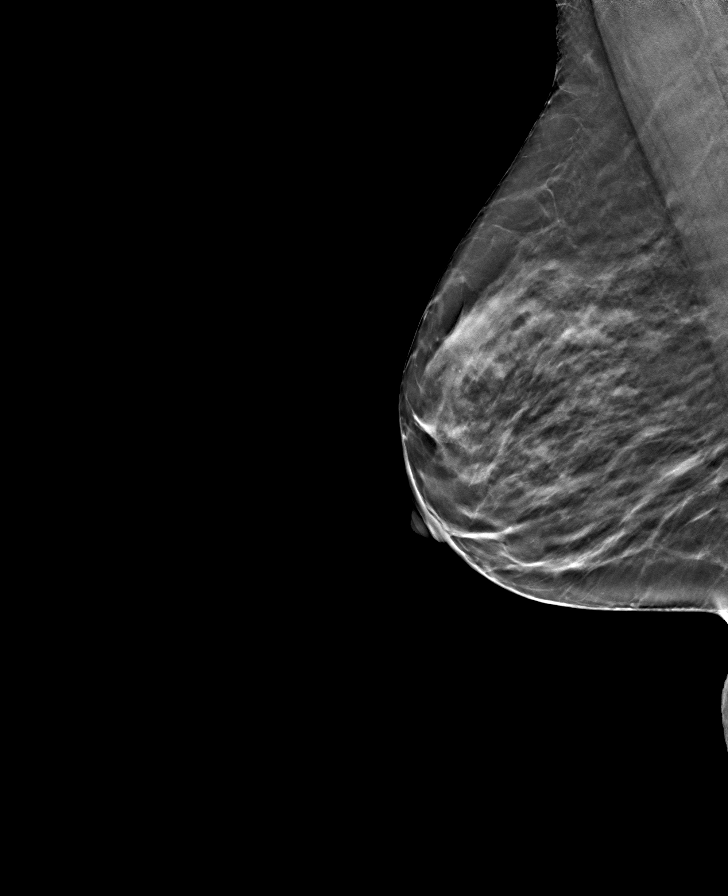

[8 of 24 positions shown; findings below may reference images not displayed]

ACR Breast Density Category c: The breast tissue is heterogeneously
dense, which may obscure small masses.
FINDINGS: The mass in the lateral aspect of the left breast is less
conspicuous on today's images and is only visualized on the cc view.
It is likely a benign intramammary lymph node. No suspicious mass
or malignant type microcalcifications identified in either breast.

Mammographic images were processed with CAD.
IMPRESSION: No evidence of malignancy in either breast.

RECOMMENDATION:
Bilateral screening mammogram in 1 year is recommended.

I have discussed the findings and recommendations with the patient.
If applicable, a reminder letter will be sent to the patient
regarding the next appointment.

BI-RADS CATEGORY  2: Benign.
